# Patient Record
Sex: Male | Born: 1970 | Race: White | Hispanic: No | Marital: Single | Smoking: Current every day smoker
Health system: Southern US, Community
[De-identification: ages and names within clinical notes are randomized; demographics above are authoritative.]

## PROBLEM LIST (undated history)

## (undated) DIAGNOSIS — I10 Essential (primary) hypertension: Secondary | ICD-10-CM

## (undated) DIAGNOSIS — E785 Hyperlipidemia, unspecified: Secondary | ICD-10-CM

## (undated) DIAGNOSIS — A150 Tuberculosis of lung: Secondary | ICD-10-CM

## (undated) DIAGNOSIS — E8881 Metabolic syndrome: Secondary | ICD-10-CM

## (undated) DIAGNOSIS — R002 Palpitations: Secondary | ICD-10-CM

## (undated) DIAGNOSIS — G459 Transient cerebral ischemic attack, unspecified: Secondary | ICD-10-CM

## (undated) DIAGNOSIS — E669 Obesity, unspecified: Secondary | ICD-10-CM

## (undated) HISTORY — DX: Palpitations: R00.2

## (undated) HISTORY — DX: Hyperlipidemia, unspecified: E78.5

## (undated) HISTORY — DX: Essential (primary) hypertension: I10

## (undated) HISTORY — DX: Obesity, unspecified: E66.9

## (undated) HISTORY — DX: Metabolic syndrome: E88.810

## (undated) HISTORY — DX: Metabolic syndrome: E88.81

## (undated) HISTORY — DX: Transient cerebral ischemic attack, unspecified: G45.9

## (undated) HISTORY — DX: Tuberculosis of lung: A15.0

---
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
## 2004-04-18 HISTORY — PX: CHOLECYSTECTOMY: SHX55

## 2008-12-30 ENCOUNTER — Encounter: Admission: RE | Admit: 2008-12-30 | Discharge: 2008-12-30 | Payer: Self-pay | Admitting: Family Medicine

## 2008-12-30 ENCOUNTER — Ambulatory Visit: Payer: Self-pay | Admitting: Family Medicine

## 2008-12-30 DIAGNOSIS — E669 Obesity, unspecified: Secondary | ICD-10-CM | POA: Insufficient documentation

## 2008-12-31 ENCOUNTER — Encounter: Payer: Self-pay | Admitting: Family Medicine

## 2008-12-31 LAB — CONVERTED CEMR LAB
AST: 33 units/L (ref 0–37)
Alkaline Phosphatase: 98 units/L (ref 39–117)
BUN: 16 mg/dL (ref 6–23)
Calcium: 9.1 mg/dL (ref 8.4–10.5)
Chloride: 106 meq/L (ref 96–112)
Creatinine, Ser: 0.87 mg/dL (ref 0.40–1.50)
HCT: 49.1 % (ref 39.0–52.0)
HDL: 26 mg/dL — ABNORMAL LOW (ref >39)
Hemoglobin: 16.8 g/dL (ref 13.0–17.0)
MCHC: 34.2 g/dL (ref 30.0–36.0)
MCV: 95.2 fL (ref 78.0–100.0)
RDW: 13.4 % (ref 11.5–15.5)
TSH: 2.187 microintl units/mL (ref 0.350–4.50)
Total CHOL/HDL Ratio: 8.5

## 2009-01-01 ENCOUNTER — Encounter: Payer: Self-pay | Admitting: Family Medicine

## 2009-01-01 ENCOUNTER — Telehealth (INDEPENDENT_AMBULATORY_CARE_PROVIDER_SITE_OTHER): Payer: Self-pay | Admitting: *Deleted

## 2009-01-07 ENCOUNTER — Ambulatory Visit: Payer: Self-pay | Admitting: Family Medicine

## 2009-02-04 ENCOUNTER — Ambulatory Visit: Payer: Self-pay | Admitting: Family Medicine

## 2009-03-18 ENCOUNTER — Ambulatory Visit: Payer: Self-pay | Admitting: Family Medicine

## 2009-04-16 ENCOUNTER — Ambulatory Visit: Payer: Self-pay | Admitting: Family Medicine

## 2009-04-16 ENCOUNTER — Encounter (INDEPENDENT_AMBULATORY_CARE_PROVIDER_SITE_OTHER): Payer: Self-pay | Admitting: *Deleted

## 2009-05-15 ENCOUNTER — Ambulatory Visit: Payer: Self-pay | Admitting: Family Medicine

## 2009-05-15 LAB — CONVERTED CEMR LAB: Blood Glucose, Fingerstick: 110

## 2009-06-23 ENCOUNTER — Telehealth: Payer: Self-pay | Admitting: Family Medicine

## 2009-07-20 ENCOUNTER — Telehealth (INDEPENDENT_AMBULATORY_CARE_PROVIDER_SITE_OTHER): Payer: Self-pay | Admitting: *Deleted

## 2010-07-07 ENCOUNTER — Encounter: Payer: Self-pay | Admitting: Family Medicine

## 2010-07-19 ENCOUNTER — Ambulatory Visit: Payer: Self-pay | Admitting: Family Medicine

## 2010-07-23 ENCOUNTER — Encounter: Payer: Self-pay | Admitting: Family Medicine

## 2010-07-23 LAB — HM DIABETES EYE EXAM

## 2010-07-26 ENCOUNTER — Encounter: Payer: Self-pay | Admitting: Family Medicine

## 2010-08-19 ENCOUNTER — Ambulatory Visit: Payer: Self-pay | Admitting: Family Medicine

## 2010-08-19 LAB — CONVERTED CEMR LAB
Albumin/Creatinine Ratio, Urine, POC: <30
Creatinine,U: 300 mg/dL

## 2010-10-20 ENCOUNTER — Ambulatory Visit: Payer: Self-pay | Admitting: Family Medicine

## 2011-01-17 ENCOUNTER — Encounter
Admission: RE | Admit: 2011-01-17 | Discharge: 2011-01-17 | Payer: Self-pay | Source: Home / Self Care | Attending: Family Medicine | Admitting: Family Medicine

## 2011-01-17 ENCOUNTER — Telehealth: Payer: Self-pay | Admitting: Family Medicine

## 2011-01-18 NOTE — Miscellaneous (Signed)
Summary: no retinopathy  Clinical Lists Changes  Observations: Added new observation of DIAB EYE EX: no retinopathy (Dr Luretha Murphy) (07/23/2010 12:50)

## 2011-01-18 NOTE — Letter (Signed)
Summary: Patient No Show/[facility]  Patient No Show/[facility]   Imported By: Lanelle Bal 08/03/2010 12:03:33  _____________________________________________________________________  External Attachment:    Type:   Image     Comment:   External Document

## 2011-01-18 NOTE — Letter (Signed)
Summary: [facility]  [facility]   Imported By: Lanelle Bal 07/15/2010 10:50:04  _____________________________________________________________________  External Attachment:    Type:   Image     Comment:   External Document

## 2011-01-18 NOTE — Assessment & Plan Note (Signed)
Summary: f/u DM   Vital Signs:  Patient profile:   40 year old male Height:      70.75 inches Weight:      251 pounds BMI:     35.38 O2 Sat:      99 % on Room air Pulse rate:   79 / minute BP sitting:   128 / 79  (left arm) Cuff size:   large  Vitals Entered By: Payton Spark CMA (August 19, 2010 8:23 AM)  O2 Flow:  Room air CC: F/U DM   Primary Care Provider:  Seymour Bars DO  CC:  F/U DM.  History of Present Illness: Zachary Hendrix is a 40 year-old male with a history DM (DKA on 7/11), HTN, hyperlipidemia and obesity.  Today he reports that he been able to get his fasting glucose levels down to between 80 and 110 and his evening readings between 120 to 140 up until this past week.  He reports difficulty with planning meals with his family because they would like to consume meals that he can not have because they raise his blood sugars.  This provides a great amount of stress for the patient.  He denies feeling ill in the morning or any hypoglycemic symptoms.    He is trying to get on a regular eating schedule and consume breakfast earlier because he does feel bad with a headache and some nausea around lunch time.  Has yet to see the nutritionist but did have his dilated eye exam and it was normal in Aug.  He denies any syncopal episodes, polydipsia, polyruria or nocturia.  In addition, no chest pain, palpitations, shortness of breath, abdomial pain, diarrhea or constipation.    Current Medications (verified): 1)  Bayer Aspirin Ec Low Dose 81 Mg Tbec (Aspirin) .Marland Kitchen.. 1 Tab By Mouth Daily 2)  Lisinopril 10 Mg Tabs (Lisinopril) .... Take 1 Tab By Mouth Once Daily 3)  Lantus 100 Unit/ml Soln (Insulin Glargine) .... Inj 15 Units Once Daily 4)  Novolog 100 Unit/ml Soln (Insulin Aspart) .... Inj Per Sliding Scale 5)  Pravastatin Sodium 40 Mg Tabs (Pravastatin Sodium) .Marland Kitchen.. 1 Tab By Mouth Qhs  Allergies (verified): 1)  ! * Pain Meds  Past History:  Past Medical History: Reviewed  history from 07/19/2010 and no changes required. 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 Student at [employer], in [employer]. Lives with mom, nieces, nephew and brother. Has 2 kids  - in [place] and [place], not involved. Smokes 1/2 ppd , with hx of 60+ pack years. Poor diet.  No exercise. Not in a relationship.  Review of Systems      See HPI       See HPI  Physical Exam  General:  alert, well-developed, and well-nourished.  obese Head:  normocephalic and atraumatic.   Eyes:  PERRLA Sclera are white and the conjunctivae are clear Mouth:  pharynx pink and moist and fair dentition.   Neck:  supple and no thyromegaly.   Lungs:  Clear to auscultation bilaterally with no crackles, wheezes, rales or rhonchi Heart:  normal rate, regular rhythm, no murmur, no gallop, and no rub.   Abdomen:  soft, non-tender, and normal bowel sounds.   Pulses:  R popliteal normal, R posterior tibial normal, R dorsalis pedis normal, L popliteal normal, L posterior tibial normal,  and L dorsalis pedis normal.   Extremities:  No edema noted in the extremities Neurologic:  DTRs symmetrical and normal.   Skin:  color normal.   Psych:  normally interactive and good eye contact.    Diabetes Management Exam:    Foot Exam (with socks and/or shoes not present):       Sensory-Pinprick/Light touch:          Left medial foot (L-4): normal          Left dorsal foot (L-5): normal          Left lateral foot (S-1): normal          Right medial foot (L-4): normal          Right dorsal foot (L-5): normal          Right lateral foot (S-1): normal       Sensory-Monofilament:          Left foot: normal          Right foot: normal       Sensory-other: Vibratory sense and position sense are groosly intact.       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  He needs to work on dietary improvements, exercise and wt loss.  He is set up to see the nutritionist.  Had a normal eye exam.  Umicroalbumin normal today.  RTC in 2 mos.  Consider start of Kombliglyze. His updated medication list for this problem includes:    Bayer Aspirin Ec Low Dose 81 Mg Tbec (Aspirin) .Marland Kitchen... 1 tab by mouth daily    Lisinopril 10 Mg Tabs (Lisinopril) .Marland Kitchen... Take 1 tab by mouth once daily    Lantus 100 Unit/ml Soln (Insulin glargine) ..... Inj 15 units once daily    Novolog 100 Unit/ml Soln (Insulin aspart) ..... Inj per sliding scale  Orders: Urine Microalbumin (36644)  Labs Reviewed: Creat: 0.87 (12/31/2008)   Microalbumin: 30 (08/19/2010)  Last Eye Exam: no retinopathy (Dr Luretha Murphy) (07/23/2010) Reviewed HgBA1c results: 6.1 (05/15/2009)  Problem # 2:  HYPERTENSION, BENIGN ESSENTIAL (ICD-401.1) BP at goal.  Continue Lisinopril.  Update fasting labs at f/u appt. His updated medication list for this problem includes:    Lisinopril 10 Mg Tabs (Lisinopril) .Marland Kitchen... Take 1 tab by mouth once daily  BP today: 128/79 Prior BP: 124/78 (07/19/2010)  Labs Reviewed: K+: 4.4 (12/31/2008) Creat: : 0.87 (12/31/2008)   Chol: 220 (12/31/2008)   HDL: 26 (12/31/2008)   LDL: See Comment mg/dL (03/47/4259)   TG: 563 (12/31/2008)  Problem # 3:  HYPERLIPIDEMIA (ICD-272.4) Update fasting labs at f/u visit. His updated medication list for this problem includes:    Pravastatin Sodium 40 Mg Tabs (Pravastatin sodium) .Marland Kitchen... 1 tab by mouth qhs  Labs Reviewed: SGOT: 33 (12/31/2008)   SGPT: 67 (12/31/2008)   HDL:26 (12/31/2008)  LDL:See Comment mg/dL (87/56/4332)  RJJO:841 (12/31/2008)  Trig:466 (12/31/2008)  Complete Medication List: 1)  Bayer Aspirin Ec Low Dose 81 Mg Tbec (Aspirin) .Marland Kitchen.. 1 tab by  mouth daily 2)  Lisinopril 10 Mg Tabs (Lisinopril) .... Take 1 tab by mouth once daily 3)  Lantus 100 Unit/ml Soln (Insulin glargine) .... Inj 15 units once daily 4)  Novolog 100 Unit/ml Soln (  Insulin aspart) .... Inj per sliding scale 5)  Pravastatin Sodium 40 Mg Tabs (Pravastatin sodium) .Marland Kitchen.. 1 tab by mouth qhs  Patient Instructions: 1)  Stay on current meds. 2)  Meet with nutriitionist to work on diabetic diet and exercise. 3)  Return for follow up with fasting labs/ A1C in 2 mos.   Prescriptions: LISINOPRIL 10 MG TABS (LISINOPRIL) Take 1 tab by mouth once daily  #30 x 2   Entered and Authorized by:   Seymour Bars DO   Signed by:   Seymour Bars DO on 08/19/2010   Method used:   Electronically to        [facility] [phone]* (retail)       [address]       [place], [place]  [address]       Ph: [phone]       Fax: [phone]   RxID:   [id number]   Laboratory Results   Urine Tests    Microalbumin (urine): 30 mg/L Creatinine: 300mg /dL  A:C Ratio <24

## 2011-01-18 NOTE — Assessment & Plan Note (Signed)
Summary: f/u DM   Vital Signs:  Patient profile:   40 year old male Height:      70.75 inches Weight:      248 pounds BMI:     34.96 O2 Sat:      100 % on Room air Pulse rate:   73 / minute BP sitting:   116 / 76  (left arm) Cuff size:   large  Vitals Entered By: Payton Spark CMA (October 20, 2010 11:00 AM)  O2 Flow:  Room air CC: F/U DM, follow-up of diabetes Type 2   Primary Care Provider:  Seymour Bars DO  CC:  F/U DM and follow-up of diabetes Type 2.  History of Present Illness:       This is a 40 year old male who presents with follow-up of diabetes Type 2.  He went to the ED last wk for tongue numbness and facial numbness.  He had an MRI done and was told that he had an allergic reaction to Pravastatin (though he started this 3 mos ago).  He was changed to Niacin.  The patient denies polyuria, nocturia, hypoglycemia requiring assistance, and nocturnal hypoglycemia.  The patient denies any infections, angina, and edema.  Since the last visit the patient admits to dietary compliance is fair.  Patient notes eye care since last visit including seen by ophthalmologist.     Current Medications (verified): 1)  Bayer Aspirin Ec Low Dose 81 Mg Tbec (Aspirin) .Marland Kitchen.. 1 Tab By Mouth Daily 2)  Lisinopril 10 Mg Tabs (Lisinopril) .... Take 1 Tab By Mouth Once Daily 3)  Lantus 100 Unit/ml Soln (Insulin Glargine) .... Inj 15 Units Once Daily 4)  Novolog 100 Unit/ml Soln (Insulin Aspart) .... Inj Per Sliding Scale 5)  Niacin 500 Mg Tabs (Niacin)  Allergies: 1)  ! * Pain Meds 2)  ! Pravachol  Past History:  Past Medical History: Reviewed history from 07/19/2010 and no changes required. 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 Student at [employer], in [place]. Lives with mom, nieces, nephew and  brother. Has 2 kids  - in [place] and [place], not involved. Smokes 1/2 ppd , with hx of 60+ pack years. Poor diet.  No exercise. Not in a relationship.  Review of Systems      See HPI  Physical Exam  General:  alert, well-developed, well-nourished, and well-hydrated.   Head:  normocephalic, atraumatic, and male-pattern balding.   Eyes:  pupils equal, pupils round, and pupils reactive to light.   Mouth:  pharynx pink and moist, poor dentition, and teeth missing.  no problems swallowing Neck:  no masses.   Lungs:  Normal respiratory effort, chest expands symmetrically. Lungs are clear to auscultation, no crackles or wheezes. Heart:  normal rate, regular rhythm, no murmur, no gallop, and no rub.   Extremities:  No edema noted in the extremities Neurologic:  sensation intact to light touch.   Skin:  color normal.   Cervical Nodes:  No lymphadenopathy noted Psych:  good eye contact, not anxious appearing, and not depressed appearing.     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  Much improved diet and has just started to exercise.  Never did see the nutritionist.  Trellis Moment, eye exam and monofilament  UTD.  Declined flu shot.  Has only used Novolog as needed 2 x since last visit.  Will start him on Metformin 500 mg two times a day.  Update fasting labs in 4 wks.  Lab order given. Cut Lantus back to 10 units once daily. RTC with sugar readings in 3 mos. His updated medication list for this problem includes:    Bayer Aspirin Ec Low Dose 81 Mg Tbec (Aspirin) .Marland Kitchen... 1 tab by mouth daily    Lisinopril 10 Mg Tabs (Lisinopril) .Marland Kitchen... Take 1 tab by mouth once daily    Lantus 100 Unit/ml Soln (Insulin glargine) ..... Inject 10 units [place] once daily    Novolog 100 Unit/ml Soln (Insulin aspart) ..... Inj per sliding scale    Metformin Hcl 500 Mg Tabs (Metformin hcl) .Marland Kitchen... 1 tab by mouth two times a day with  meals  Orders: Fingerstick (04540) Hemoglobin A1C (98119) T-Comprehensive Metabolic Panel [phone])  Labs Reviewed: Creat: 0.87 (12/31/2008)   Microalbumin: 30 (08/19/2010)  Last Eye Exam: no retinopathy (Dr Luretha Murphy) (07/23/2010) Reviewed HgBA1c results: 5.7 (10/20/2010)  6.1 (05/15/2009)  Problem # 2:  HYPERLIPIDEMIA (ICD-272.4) Apparently, the ED thought he was allergic to Pravastatin given his tongue swelling last wk which has resolved off the Pravastatin.  he is now on Niacin.  Recheck Lipids with labs in 4 wks. The following medications were removed from the medication list:    Pravastatin Sodium 40 Mg Tabs (Pravastatin sodium) .Marland Kitchen... 1 tab by mouth qhs His updated medication list for this problem includes:    Niacin 500 Mg Tabs (Niacin)  Labs Reviewed: SGOT: 33 (12/31/2008)   SGPT: 67 (12/31/2008)   HDL:26 (12/31/2008)  LDL:See Comment mg/dL (30/86/5784)  ONGE:952 (12/31/2008)  Trig:466 (12/31/2008)  Orders: T-Lipid Profile ([id number])  Problem # 3:  HYPERTENSION, BENIGN ESSENTIAL (ICD-401.1) BP at goal on Lisinopril.  Continue. His updated medication list for this problem includes:    Lisinopril 10 Mg Tabs (Lisinopril) .Marland Kitchen... Take 1 tab by mouth once daily  BP today: 116/76 Prior BP: 128/79 (08/19/2010)  Labs Reviewed: K+: 4.4 (12/31/2008) Creat: : 0.87 (12/31/2008)   Chol: 220 (12/31/2008)   HDL: 26 (12/31/2008)   LDL: See Comment mg/dL (27/25/3664)   TG: 403 (12/31/2008)  Complete Medication List: 1)  Bayer Aspirin Ec Low Dose 81 Mg Tbec (Aspirin) .Marland Kitchen.. 1 tab by mouth daily 2)  Lisinopril 10 Mg Tabs (Lisinopril) .... Take 1 tab by mouth once daily 3)  Lantus 100 Unit/ml Soln (Insulin glargine) .... Inject 10 units [place] once daily 4)  Novolog 100 Unit/ml Soln (Insulin aspart) .... Inj per sliding scale 5)  Niacin 500 Mg Tabs (Niacin) 6)  Exel Pen Needles 1/3" 31g X 8 Mm Misc (Insulin pen needle) .... Use daily as directed 7)  Metformin Hcl 500 Mg Tabs  (Metformin hcl) .Marland Kitchen.. 1 tab by mouth two times a day with meals  Other Orders: T-CBC No Diff ([id number]) T-Iron ([id number]) T-Vitamin B12 (88416-60630)  Patient Instructions: 1)  Start Metformin 500 mg twice daily -- take with breakfast and dinner. 2)  Cut back on Lantus to 10 units once daily (OK to use PEN). 3)  Use NOVOLOG as needed for sliding scale only. 4)  Stick to LOW CARB/ low sugar diet and add 1 hr of exercise 5-6 days/ wk. 5)  AM fasting goal 80-120. 6)  2 hrs after dinner goal <150. 7)  Update fasting labs in 1 month. 8)  REturn for follow up in 3 mos. Prescriptions: METFORMIN HCL  500 MG TABS (METFORMIN HCL) 1 tab by mouth two times a day with meals  #60 x 3   Entered and Authorized by:   Seymour Bars DO   Signed by:   Seymour Bars DO on 10/20/2010   Method used:   Printed then faxed to ...       [facility] [phone]* (retail)       [address]       [place], [place]  [address]       Ph: [phone]       Fax: [phone]   RxID:   [id number] EXEL PEN NEEDLES 1/3" 31G X 8 MM MISC (INSULIN PEN NEEDLE) use daily as directed  #30 x 3   Entered and Authorized by:   Seymour Bars DO   Signed by:   Seymour Bars DO on 10/20/2010   Method used:   Electronically to        [facility] [phone]* (retail)       [address]       [place], [place]  [address]       Ph: [phone]       Fax: [phone]   RxID:   [id number]    Orders Added: 1)  Fingerstick [36416] 2)  Hemoglobin A1C [83036] 3)  T-CBC No Diff [85027-10000] 4)  T-Iron [35573-22025] 5)  T-Comprehensive Metabolic Panel [80053-22900] 6)  T-Lipid Profile [80061-22930] 7)  T-Vitamin B12 [82607-23330] 8)  Est. Patient Level IV [99214]    Laboratory Results   Blood Tests     HGBA1C: 5.7%   (Normal Range: Non-Diabetic - 3-6%   Control Diabetic - 6-8%)

## 2011-01-18 NOTE — Assessment & Plan Note (Signed)
Summary: HFU DKA   Vital Signs:  Patient profile:   40 year old male Height:      70.75 inches Weight:      255 pounds BMI:     35.95 O2 Sat:      98 % on Room air Pulse rate:   76 / minute BP sitting:   124 / 78  (left arm) Cuff size:   large  Vitals Entered By: Payton Spark CMA (July 19, 2010 9:06 AM)  O2 Flow:  Room air CC: Hosp f/u. AM fasting sugar 126.   Primary Care Provider:  Seymour Bars DO  CC:  Hosp f/u. AM fasting sugar 126.Zachary Hendrix  History of Present Illness: 40 yo WM presents for HFU visit.  He was admitted to [facility] with DKA.  He had been feeling ill for 2 wks prior to going to the hospital.  He had polydipsia and had been drinking lots of milk and soda.  Prior to this, he had diet - controlled DM and I had not seen in a year.    He was discharged home on Lantus 15 units at bedtime.  His AM fastings are running around 150.  He is rarely needing to use his Novolog for SSI which he had to purchase out of pocket.  He has trouble paying for meds.  His energy level and GI symptoms have really improved.  His polydipsia has resolved.  He has blurry vision.  He has an eye appt today.  He was put back on Lisinopril for his HTN.    He did not attend nutrion classes but found out that he can go for free in [employer].     [employer] meter with test strips.   Current Medications (verified): 1)  Bayer Aspirin Ec Low Dose 81 Mg Tbec (Aspirin) .Zachary Hendrix.. 1 Tab By Mouth Daily 2)  Lisinopril 10 Mg Tabs (Lisinopril) .... Take 1 Tab By Mouth Once Daily 3)  Lantus 100 Unit/ml Soln (Insulin Glargine) .... Inj 15 Units Once Daily 4)  Novolog 100 Unit/ml Soln (Insulin Aspart) .... Inj Per Sliding Scale 5)  Potassium  Allergies (verified): 1)  ! 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 R foot surgery 1982 lap chole 04-2004  Social History: Reviewed history from 12/30/2008 and no changes required. Student at [employer], in [place]. Lives with mom, nieces, nephew and brother. Has 2 kids  - in [place] and [place], not involved. Smokes 1/2 ppd , with hx of 60+ pack years. Poor diet.  No exercise. Not in a relationship.  Review of Systems General:  Complains of weight loss; denies fatigue, fever, loss of appetite, and sweats. Eyes:  Complains of blurring. CV:  Denies chest pain or discomfort, shortness of breath with exertion, and swelling of feet. GI:  Denies diarrhea, nausea, and vomiting. GU:  Complains of urinary frequency.  Physical Exam  General:  alert, well-developed, well-nourished, well-hydrated, and overweight-appearing.   Head:  normocephalic and atraumatic.   Eyes:  pupils equal, pupils round, and pupils reactive to light.   Mouth:  pharynx pink and moist.  2+ tonsilar hypertrophy Neck:  no masses.   Lungs:  normal respiratory effort, no intercostal retractions, no accessory muscle use, normal breath sounds, and no wheezes. Heart:  Normal rate and regular rhythm. S1 and S2 normal without gallop, murmur, click, rub or other extra sounds. Extremities:  1+ pitting LE edema bilat Neurologic:  sensation intact to light touch.   Skin:  color normal.   Cervical Nodes:  No lymphadenopathy noted Psych:  good eye contact, not anxious appearing, and not depressed appearing.     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  His A1C was 13.7 last wk.  His sugars have much improved with addition of insulin.  Will set up nutrition counseling for new dx.  Continue current meds.  Has a meter and test strips.  Set up for eye exam today.  Will get urine microalbumin at next visit. The following medications were removed from the  medication list:    Enalapril Maleate 10 Mg Tabs (Enalapril maleate) .Zachary Hendrix... 1 tab by mouth daily His updated medication list for this problem includes:    Bayer Aspirin Ec Low Dose 81 Mg Tbec (Aspirin) .Zachary Hendrix... 1 tab by mouth daily    Lisinopril 10 Mg Tabs (Lisinopril) .Zachary Hendrix... Take 1 tab by mouth once daily    Lantus 100 Unit/ml Soln (Insulin glargine) ..... Inj 15 units once daily    Novolog 100 Unit/ml Soln (Insulin aspart) ..... Inj per sliding scale  Orders: Nutrition Referral (Nutrition)  Problem # 2:  HYPERLIPIDEMIA (ICD-272.4) Tchol was 416 with TGS 3000 in the hospital which did improved to 2400 after sugars had improved.  Will add Pravastatin at bedtime.  I do expect improvement with improvement in sugars. The following medications were removed from the medication list:    Pravastatin Sodium 40 Mg Tabs (Pravastatin sodium) .Zachary Hendrix... 1 tab by mouth qhs His updated medication list for this problem includes:    Pravastatin Sodium 40 Mg Tabs (Pravastatin sodium) .Zachary Hendrix... 1 tab by mouth qhs  Problem # 3:  HYPERTENSION, BENIGN ESSENTIAL (ICD-401.1) Assessment: Improved  The following medications were removed from the medication list:    Metoprolol Tartrate 50 Mg Tabs (Metoprolol tartrate) .Zachary Hendrix... 1 tab by mouth bid    Enalapril Maleate 10 Mg Tabs (Enalapril maleate) .Zachary Hendrix... 1 tab by mouth daily    Chlorthalidone 50 Mg Tabs (Chlorthalidone) .Zachary Hendrix... Take 1 tablet by mouth once a day His updated medication list for this problem includes:    Lisinopril 10 Mg Tabs (Lisinopril) .Zachary Hendrix... Take 1 tab by mouth once daily  BP today: 124/78 Prior BP: 131/89 (05/15/2009)  Labs Reviewed: K+: 4.4 (12/31/2008) Creat: : 0.87 (12/31/2008)   Chol: 220 (12/31/2008)   HDL: 26 (12/31/2008)   LDL: See Comment mg/dL (95/62/1308)   TG: 657 (12/31/2008)  Problem # 4:  OTHER NONSPECIFIC ABNORMAL SERUM ENZYME LEVELS (ICD-790.5) LFTS high during hosp visit, likely due to fatty liver. Will recheck at 4 wk f/u appt esp with  addition of statin. Will need RUQ u/s and hepatitis screen if not improved.  Complete Medication List: 1)  Bayer Aspirin Ec Low Dose 81 Mg Tbec (Aspirin) .Zachary Hendrix.. 1 tab by mouth daily 2)  Lisinopril 10 Mg Tabs (Lisinopril) .... Take 1 tab by mouth once daily 3)  Lantus 100 Unit/ml Soln (Insulin glargine) .... Inj 15 units once daily 4)  Novolog 100 Unit/ml Soln (Insulin aspart) .... Inj per sliding scale 5)  Pravastatin Sodium 40 Mg Tabs (Pravastatin sodium) .Zachary Hendrix.. 1 tab by mouth qhs  Patient Instructions: 1)  Start on Pravastatin at bedtime for high cholesterol. 2)  Stay on Lantus 15 units once a  day. 3)  Check sugar AM fasting (80-120 is goal).  and 2 hrs after dinner (100-150 is goal). 4)  Will refer to nutritionist. 5)  Return in 4 wks for follow up diabetes.  (will need CMP, FLP, urine microalbuminuria).   Prescriptions: PRAVASTATIN SODIUM 40 MG TABS (PRAVASTATIN SODIUM) 1 tab by mouth qhs  #30 x 2   Entered and Authorized by:   Seymour Bars DO   Signed by:   Seymour Bars DO on 07/19/2010   Method used:   Electronically to        [facility] [phone]* (retail)       [address]       [place], [place]  [address]       Ph: [phone]       Fax: [phone]   RxID:   [id number] PRAVASTATIN SODIUM 40 MG TABS (PRAVASTATIN SODIUM) 1 tab by mouth qhs  #30 x 2   Entered and Authorized by:   Seymour Bars DO   Signed by:   Seymour Bars DO on 07/19/2010   Method used:   Electronically to        [facility] [phone]* (retail)       [address]       [place], [place]  [address]       Ph: [phone]       Fax: [phone]   RxID:   [id number]

## 2011-01-20 ENCOUNTER — Encounter: Payer: Self-pay | Admitting: Family Medicine

## 2011-01-20 ENCOUNTER — Ambulatory Visit (INDEPENDENT_AMBULATORY_CARE_PROVIDER_SITE_OTHER): Payer: Self-pay | Admitting: Family Medicine

## 2011-01-20 ENCOUNTER — Ambulatory Visit: Admit: 2011-01-20 | Payer: Self-pay | Admitting: Family Medicine

## 2011-01-20 DIAGNOSIS — E119 Type 2 diabetes mellitus without complications: Secondary | ICD-10-CM

## 2011-01-20 DIAGNOSIS — E785 Hyperlipidemia, unspecified: Secondary | ICD-10-CM

## 2011-01-20 DIAGNOSIS — E109 Type 1 diabetes mellitus without complications: Secondary | ICD-10-CM

## 2011-01-26 NOTE — Progress Notes (Signed)
Summary: Needs chest xray  Phone Note Call from Patient   Caller: Patient Summary of Call: Pt needs order for chest xray for TB documentation. Pt will have xray done this afternoon.  Initial call taken by: Payton Spark CMA,  January 17, 2011 2:46 PM  Follow-up for Phone Call        done. Follow-up by: Seymour Bars DO,  January 17, 2011 3:06 PM     Appended Document: Needs chest xray faxed

## 2011-01-26 NOTE — Assessment & Plan Note (Signed)
Summary: f/u DM   Vital Signs:  Patient profile:   40 year old male Height:      70.75 inches Weight:      259 pounds BMI:     36.51 O2 Sat:      98 % on Room air Pulse rate:   84 / minute BP sitting:   136 / 89  (left arm) Cuff size:   large  Vitals Entered By: Payton Spark CMA (January 20, 2011 10:14 AM)  O2 Flow:  Room air CC: F/U DM   Primary Care Provider:  Seymour Bars DO  CC:  F/U DM.  History of Present Illness: Zachary Hendrix presents for f/u T2DM.  He was started on Metformin 500 mg two times a day 3 mos ago and is still taking Lantus 10 units once a day.  He has not needed the Novolog at all.  He has some dietary non adherence.  AM fastings are 120-140.  He is still looking for work as a Engineer, civil (consulting).  He is not exercising at all and has continued to gain wt.      Current Medications (verified): 1)  Bayer Aspirin Ec Low Dose 81 Mg Tbec (Aspirin) .Marland Kitchen.. 1 Tab By Mouth Daily 2)  Lisinopril 10 Mg Tabs (Lisinopril) .... Take 1 Tab By Mouth Once Daily 3)  Lantus 100 Unit/ml Soln (Insulin Glargine) .... Inject 10 Units [place] Once Daily 4)  Novolog 100 Unit/ml Soln (Insulin Aspart) .... Inj Per Sliding Scale 5)  Niacin 500 Mg Tabs (Niacin) 6)  Exel Pen Needles 1/3" 31g X 8 Mm Misc (Insulin Pen Needle) .... Use Daily As Directed 7)  Metformin Hcl 500 Mg Tabs (Metformin Hcl) .Marland Kitchen.. 1 Tab By Mouth Two Times A Day With Meals  Allergies (verified): 1)  ! * Pain Meds 2)  ! Pravachol  Past History:  Past Medical History: Reviewed history from 07/19/2010 and no changes required. HTN obesity high cholesterol DM (DKA 06-2010) TIA Irreg heartbeat/ palpitations  metabolic syndrome hx of TB, treated in 2000, 12-2008 CXR normal   Impression & Recommendations:  Problem # 1:  DIABETES MELLITUS, TYPE II (ICD-250.00) A1C looks great at 5.8.  Will STOP both Novolog and Lantus.  Will increase Metformin to 1gram in the AM and 500 mg at night with food.  He does need to work on his dietary  adherence, wt loss and regular exercise given BMI of 36.5 c/w class II obesity.  BP a little high today.  Overdue for labs, so obtained non fasting today. The following medications were removed from the medication list:    Lantus 100 Unit/ml Soln (Insulin glargine) ..... Inject 10 units [place] once daily    Novolog 100 Unit/ml Soln (Insulin aspart) ..... Inj per sliding scale His updated medication list for this problem includes:    Bayer Aspirin Ec Low Dose 81 Mg Tbec (Aspirin) .Marland Kitchen... 1 tab by mouth daily    Lisinopril 10 Mg Tabs (Lisinopril) .Marland Kitchen... Take 1 tab by mouth once daily    Metformin Hcl 500 Mg Tabs (Metformin hcl) .Marland Kitchen... 2 tabs by mouth qam and 1 tab by mouth qpm with food  Orders: T-Comprehensive Metabolic Panel [phone])  Labs Reviewed: Creat: 0.87 (12/31/2008)   Microalbumin: 30 (08/19/2010)  Last Eye Exam: no retinopathy (Dr Luretha Murphy) (07/23/2010) Reviewed HgBA1c results: 5.8 (01/20/2011)  5.7 (10/20/2010)  Problem # 2:  HYPERLIPIDEMIA (ICD-272.4) Check LDL -direct today.  Off statin due to allergic rxn per the ED. His updated medication  list for this problem includes:    Niacin 500 Mg Tabs (Niacin)  Orders: T-Lipoprotein (LDL cholesterol)  ([id number])  Complete Medication List: 1)  Bayer Aspirin Ec Low Dose 81 Mg Tbec (Aspirin) .Marland Kitchen.. 1 tab by mouth daily 2)  Lisinopril 10 Mg Tabs (Lisinopril) .... Take 1 tab by mouth once daily 3)  Niacin 500 Mg Tabs (Niacin) 4)  Exel Pen Needles 1/3" 31g X 8 Mm Misc (Insulin pen needle) .... Use daily as directed 5)  Metformin Hcl 500 Mg Tabs (Metformin hcl) .... 2 tabs by mouth qam and 1 tab by mouth qpm with food  Other Orders: Fingerstick (36416) Hemoglobin A1C (13244)  Patient Instructions: 1)  Stop Novolog. 2)  Stop Lantus. 3)  Increase Metformin to 2 tabs in the morning and 1 tab in the evening (with meals). 4)  AM fasting goal is 80-120; 2 hr after dinner goal is <150. 5)  Adhere to LOW SUGAR, LOW CARB DIET with  regular exercise. 6)  Labs today. 7)  Will call you w/ results tomorrow. 8)  Return for follow up in 4 mos. Prescriptions: METFORMIN HCL 500 MG TABS (METFORMIN HCL) 2 tabs by mouth qAM and 1 tab by mouth qPM with food  #90 x 3   Entered and Authorized by:   Seymour Bars DO   Signed by:   Seymour Bars DO on 01/20/2011   Method used:   Electronically to        [facility] [phone]* (retail)       [address]       [place], [place]  [address]       Ph: [phone]       Fax: [phone]   RxID:   [id number]    Orders Added: 1)  Fingerstick [36416] 2)  Hemoglobin A1C [83036] 3)  T-Lipoprotein (LDL cholesterol)  [[id number]] 4)  T-Comprehensive Metabolic Panel [[id number]] 5)  Est. Patient Level III [99213]    Laboratory Results   Blood Tests     HGBA1C: 5.8%   (Normal Range: Non-Diabetic - 3-6%   Control Diabetic - 6-8%)

## 2011-05-22 ENCOUNTER — Encounter: Payer: Self-pay | Admitting: Family Medicine

## 2011-05-25 ENCOUNTER — Telehealth: Payer: Self-pay | Admitting: Family Medicine

## 2011-05-25 ENCOUNTER — Ambulatory Visit (INDEPENDENT_AMBULATORY_CARE_PROVIDER_SITE_OTHER): Payer: PRIVATE HEALTH INSURANCE | Admitting: Family Medicine

## 2011-05-25 ENCOUNTER — Encounter: Payer: Self-pay | Admitting: Family Medicine

## 2011-05-25 VITALS — BP 156/94 | HR 67 | Ht 72.0 in | Wt 262.0 lb

## 2011-05-25 DIAGNOSIS — I10 Essential (primary) hypertension: Secondary | ICD-10-CM

## 2011-05-25 DIAGNOSIS — F17200 Nicotine dependence, unspecified, uncomplicated: Secondary | ICD-10-CM | POA: Insufficient documentation

## 2011-05-25 DIAGNOSIS — E785 Hyperlipidemia, unspecified: Secondary | ICD-10-CM

## 2011-05-25 DIAGNOSIS — E119 Type 2 diabetes mellitus without complications: Secondary | ICD-10-CM

## 2011-05-25 MED ORDER — METFORMIN HCL ER 750 MG PO TB24: 750.0000 mg | ORAL_TABLET | Freq: Every day | ORAL | Status: DC

## 2011-05-25 MED ORDER — LISINOPRIL 20 MG PO TABS: 20.0000 mg | ORAL_TABLET | Freq: Every day | ORAL | Status: DC

## 2011-05-25 NOTE — Assessment & Plan Note (Signed)
A1C looks great at 5.6.  Will change his metformin to XR,. 750 mg once daily.  He plans to continue to work on diet, exercise, wt loss.

## 2011-05-25 NOTE — Patient Instructions (Signed)
Change Metformin to Metformin XR 750 mg with breakfast daily.  Increase Lisinopril to 20 mg once daily for BP.  Update fasting labs one morning downstairs. Will call you w/ results.  Work on adding regular exercise and smoking cessation.  Return for a PHYSICAL in 4 months.

## 2011-05-25 NOTE — Progress Notes (Signed)
  Subjective:    Patient ID: Zachary Hendrix, male    DOB: 01/22/1971, 40 y.o.   MRN: [id number]  HPI  40 yo WM presents for f/u T2DM.  He is doing well on metformin, on 1 gram in the AM and 500 mg in the evening.  He has yet to start exercising.  He is working long hours, so has a hard time remembering to take all of his meds.  Fasting labs are due.  Denies chest pain or DOE.  His eye exam and urine microalbumin are UTD.  Not routinely checking his sugars at home.  Continues to smoke.  Not ready to quit.    BP 156/94  Pulse 67  Ht 6' (1.829 m)  Wt 262 lb (118.842 kg)  BMI 35.53 kg/m2  SpO2 99% Patient Active Problem List  Diagnoses  . PRIM TB INF UNS EX UNKN  . B12 DEFICIENCY  . HYPERLIPIDEMIA  . OBESITY, UNSPECIFIED  . ANEMIA, IRON DEFICIENCY  . HYPERTENSION, BENIGN ESSENTIAL  . CONSTIPATION  . DIABETES MELLITUS, TYPE II  . Tobacco use disorder      Review of Systems  Constitutional: Negative for fatigue and unexpected weight change.  Eyes: Negative for visual disturbance.  Respiratory: Negative for shortness of breath.   Cardiovascular: Negative for chest pain, palpitations and leg swelling.  Gastrointestinal: Negative for abdominal pain.  Genitourinary: Negative for frequency.  Neurological: Negative for numbness.  Psychiatric/Behavioral: Negative for dysphoric mood.       Objective:   Physical Exam  Constitutional: He appears well-developed and well-nourished. No distress.  Neck: Neck supple. No thyromegaly present.  Cardiovascular: Normal rate, regular rhythm and normal heart sounds.   Pulmonary/Chest: Effort normal and breath sounds normal. No respiratory distress. He has no wheezes.  Musculoskeletal: He exhibits no edema.  Lymphadenopathy:    He has no cervical adenopathy.  Skin: Skin is warm and dry.  Psychiatric: He has a normal mood and affect.          Assessment & Plan:

## 2011-05-25 NOTE — Assessment & Plan Note (Signed)
BP remains high.  Will go up on his lisinopril from 10--> 20 mg/ day and will update his fasting labs.

## 2011-06-08 ENCOUNTER — Telehealth: Payer: Self-pay | Admitting: *Deleted

## 2011-06-08 ENCOUNTER — Ambulatory Visit: Payer: PRIVATE HEALTH INSURANCE | Admitting: Family Medicine

## 2011-06-08 DIAGNOSIS — R748 Abnormal levels of other serum enzymes: Secondary | ICD-10-CM

## 2011-06-08 LAB — COMPLETE METABOLIC PANEL WITH GFR
Alkaline Phosphatase: 84 U/L (ref 39–117)
BUN: 13 mg/dL (ref 6–23)
Creat: 0.92 mg/dL (ref 0.50–1.35)
GFR, Est African American: >60 mL/min (ref >60)
GFR, Est Non African American: >60 mL/min (ref >60)
Glucose, Bld: 102 mg/dL — ABNORMAL HIGH (ref 70–99)
Total Bilirubin: 0.5 mg/dL (ref 0.3–1.2)

## 2011-06-08 LAB — LIPID PANEL
Cholesterol: 176 mg/dL (ref 0–200)
HDL: 26 mg/dL — ABNORMAL LOW (ref >39)
Total CHOL/HDL Ratio: 6.8 Ratio

## 2011-06-08 LAB — CBC WITH DIFFERENTIAL/PLATELET
Basophils Relative: 0 % (ref 0–1)
Eosinophils Absolute: 0.5 10*3/uL (ref 0.0–0.7)
Eosinophils Relative: 5 % (ref 0–5)
HCT: 47.6 % (ref 39.0–52.0)
Hemoglobin: 16.5 g/dL (ref 13.0–17.0)
MCH: 32.2 pg (ref 26.0–34.0)
MCHC: 34.7 g/dL (ref 30.0–36.0)
MCV: 93 fL (ref 78.0–100.0)
Monocytes Absolute: 0.4 10*3/uL (ref 0.1–1.0)
Monocytes Relative: 4 % (ref 3–12)
Neutrophils Relative %: 54 % (ref 43–77)

## 2011-06-08 MED ORDER — PROMETHAZINE HCL 25 MG PO TABS: 25.0000 mg | ORAL_TABLET | Freq: Four times a day (QID) | ORAL | Status: AC | PRN

## 2011-06-08 NOTE — Telephone Encounter (Signed)
none

## 2011-06-08 NOTE — Telephone Encounter (Signed)
I sent in oral phenergan tabs for nausea.  Remember they will make you sleepy.  Sip on clear fluids today and advance diet slowly as you start to feel better.  OK to add Immodium AD as needed for diarrhea today and tomorrow.  Let us know if not resolved by Monday.

## 2011-06-08 NOTE — Telephone Encounter (Signed)
LMOM for Pt to CB 

## 2011-06-08 NOTE — Telephone Encounter (Signed)
Pls let pt know that his blood counts came back normal.  His liver enzymes are a little high.  His TGs are elevated at 294 (<150 is goal) and his HDL good chol is too low at 26.  Is he taking his Niaspan everyday for this?    Work on [employer] and regular exercise. Will order an U/s of his liver to be done downstairs next wk.

## 2011-06-08 NOTE — Telephone Encounter (Signed)
Pt states he has had N/V/D x 4 days. He denies fever and pain. I advised Pt to stay hydrated and get plenty of rest. Pt will go to ED if he developes fever or pain. Pt does request phenergan be sent to pharm. Please advise.

## 2011-06-09 ENCOUNTER — Ambulatory Visit
Admission: RE | Admit: 2011-06-09 | Discharge: 2011-06-09 | Disposition: A | Discharge status: Home / Self Care | Payer: PRIVATE HEALTH INSURANCE | Source: Ambulatory Visit | Attending: Family Medicine | Admitting: Family Medicine

## 2011-06-09 ENCOUNTER — Telehealth: Payer: Self-pay | Admitting: Family Medicine

## 2011-06-09 DIAGNOSIS — R748 Abnormal levels of other serum enzymes: Secondary | ICD-10-CM

## 2011-06-09 NOTE — Telephone Encounter (Signed)
Pt called this am regarding his labs.  Pt re-notified regarding his labs.  He is taking his Niaspan everyday.  Gave detailed instructions. Jarvis Newcomer, LPN Domingo Dimes

## 2011-06-10 ENCOUNTER — Telehealth: Payer: Self-pay | Admitting: Family Medicine

## 2011-06-10 NOTE — Telephone Encounter (Signed)
Called patient: The ultrasound of his abdomen shows he has a fatty liver. An little bit of enlargement of his liver. The main treatment for this is exercise, low fat diet and weight loss. Over time if he does not improve his fatty liver disease then he can call scarring of the liver. I do recommend we recheck his liver enzymes in approximately 3-4 months.

## 2011-06-13 NOTE — Telephone Encounter (Signed)
Pt aware of the above  

## 2011-08-03 ENCOUNTER — Telehealth: Payer: Self-pay | Admitting: Family Medicine

## 2011-08-03 ENCOUNTER — Ambulatory Visit: Payer: PRIVATE HEALTH INSURANCE | Admitting: Family Medicine

## 2011-08-03 MED ORDER — METFORMIN HCL 500 MG PO TABS: 500.0000 mg | ORAL_TABLET | Freq: Two times a day (BID) | ORAL | Status: DC

## 2011-08-03 NOTE — Telephone Encounter (Signed)
Pt showed up in the lobby and is complaining that his last office visit 05-25-11 he was changed to Metformin XR 750mg  PO and since that time his BS'S have been running higher 128-184.  Pt is requesting to go back to Metformin 500mg  PO BID because his sugars on that med was running <120.  Pt said his mother spoke with Payton Spark, CMA yesterday and Marcelino Duster had said the pt could come in today for just a HgbA1C only today.  There wasn't a telephone note or in basket mess to let me know that this had been communicated. Plan:  Spoke with Dr. Linford Arnold and will change the pt back to his Metformin 500mg  PO BID and needs to return around Sept  2012 for a diabetic followup.  Pt said he was told Oct 2012 by Dr. Cathey Endow since he is due for CPE.  Told pt that should be fine but to keep running log of his BS's and to call with any problems.   Addendum:  New script for the metformin 500mg  PO BID #60/2 refills was sent to [facility]. Jarvis Newcomer, LPN [facility] Dimes

## 2011-08-04 NOTE — Progress Notes (Signed)
This was entered in error

## 2011-08-10 ENCOUNTER — Encounter: Payer: Self-pay | Admitting: Family Medicine

## 2011-08-24 ENCOUNTER — Ambulatory Visit: Payer: PRIVATE HEALTH INSURANCE | Admitting: Family Medicine

## 2011-08-30 ENCOUNTER — Ambulatory Visit: Payer: PRIVATE HEALTH INSURANCE | Admitting: Family Medicine

## 2011-08-30 DIAGNOSIS — Z0289 Encounter for other administrative examinations: Secondary | ICD-10-CM

## 2011-09-29 ENCOUNTER — Encounter: Payer: PRIVATE HEALTH INSURANCE | Admitting: Family Medicine

## 2011-09-29 DIAGNOSIS — Z0289 Encounter for other administrative examinations: Secondary | ICD-10-CM

## 2011-10-14 ENCOUNTER — Telehealth: Payer: Self-pay | Admitting: *Deleted

## 2012-08-31 IMAGING — CR DG CHEST 2V
2 series · 2 of 2 positions shown · non-contrast
Comparison: 12/30/2008

CLINICAL DATA: Employment physical

CHEST - 2 VIEW

[view not recorded (1 of 2)]
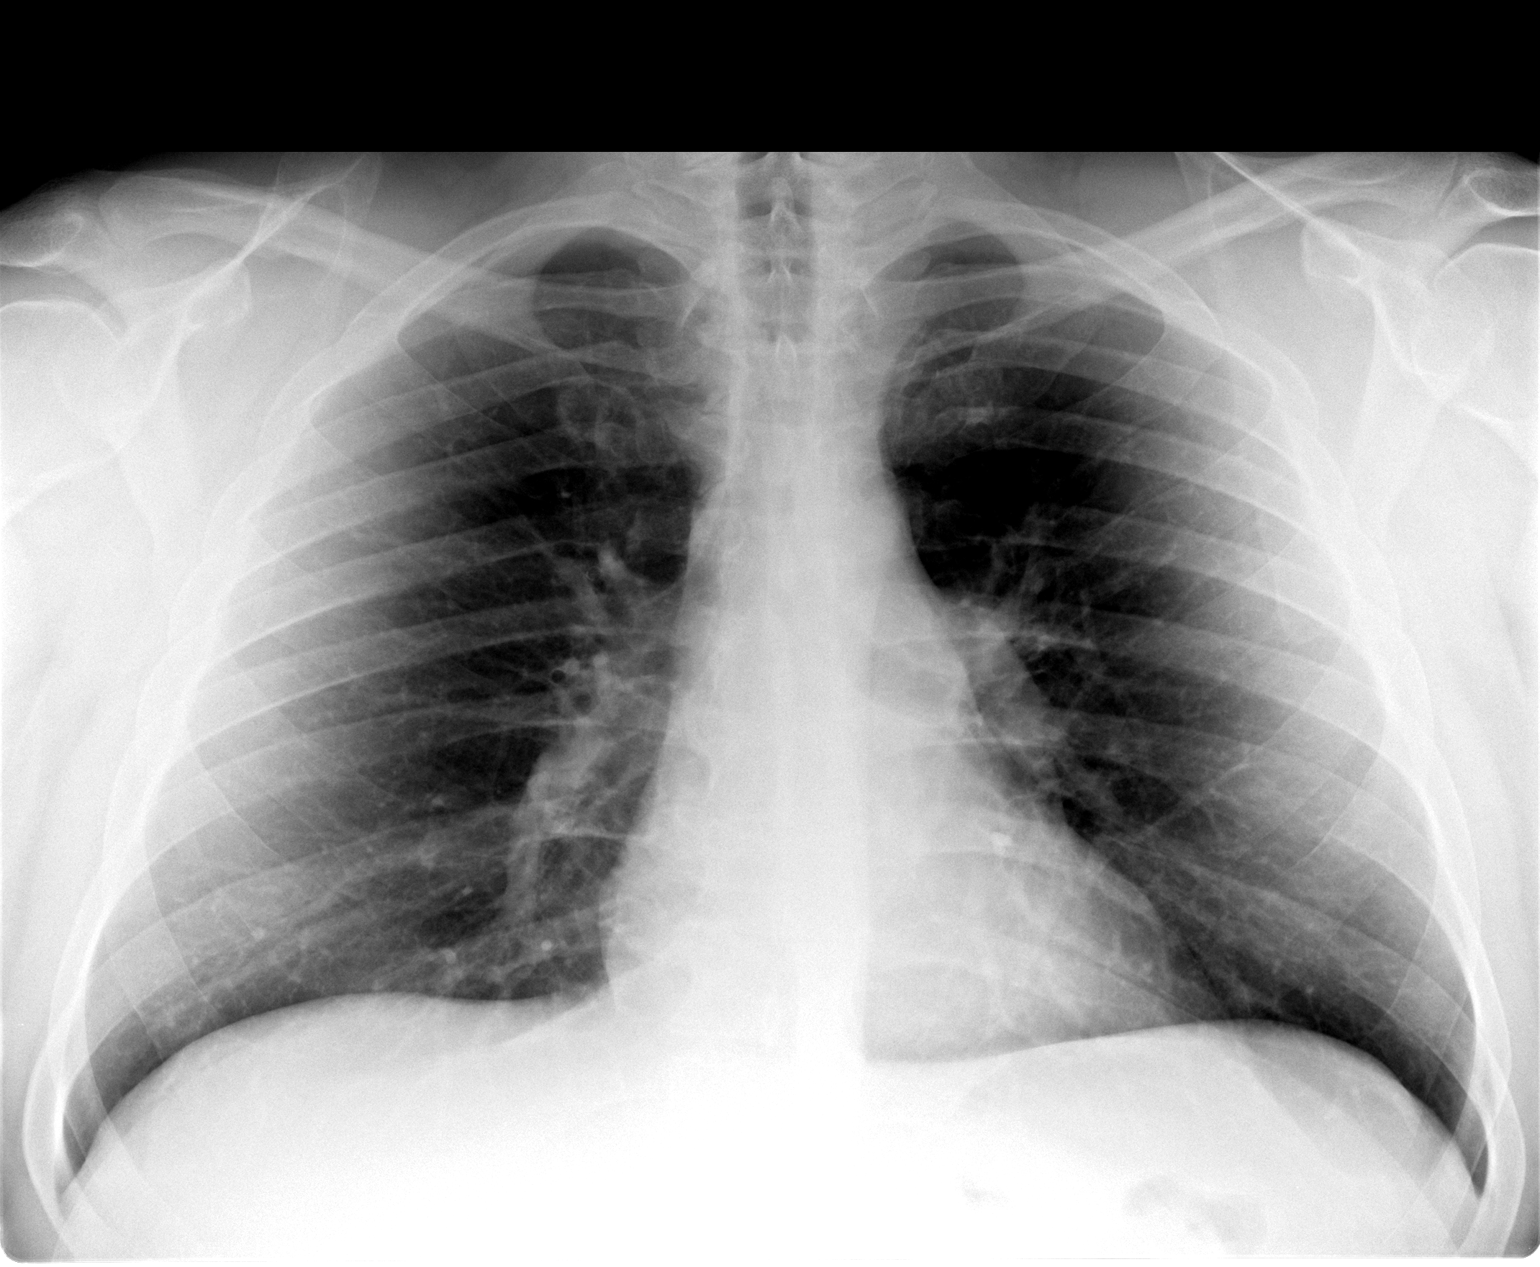

[view not recorded (2 of 2)]
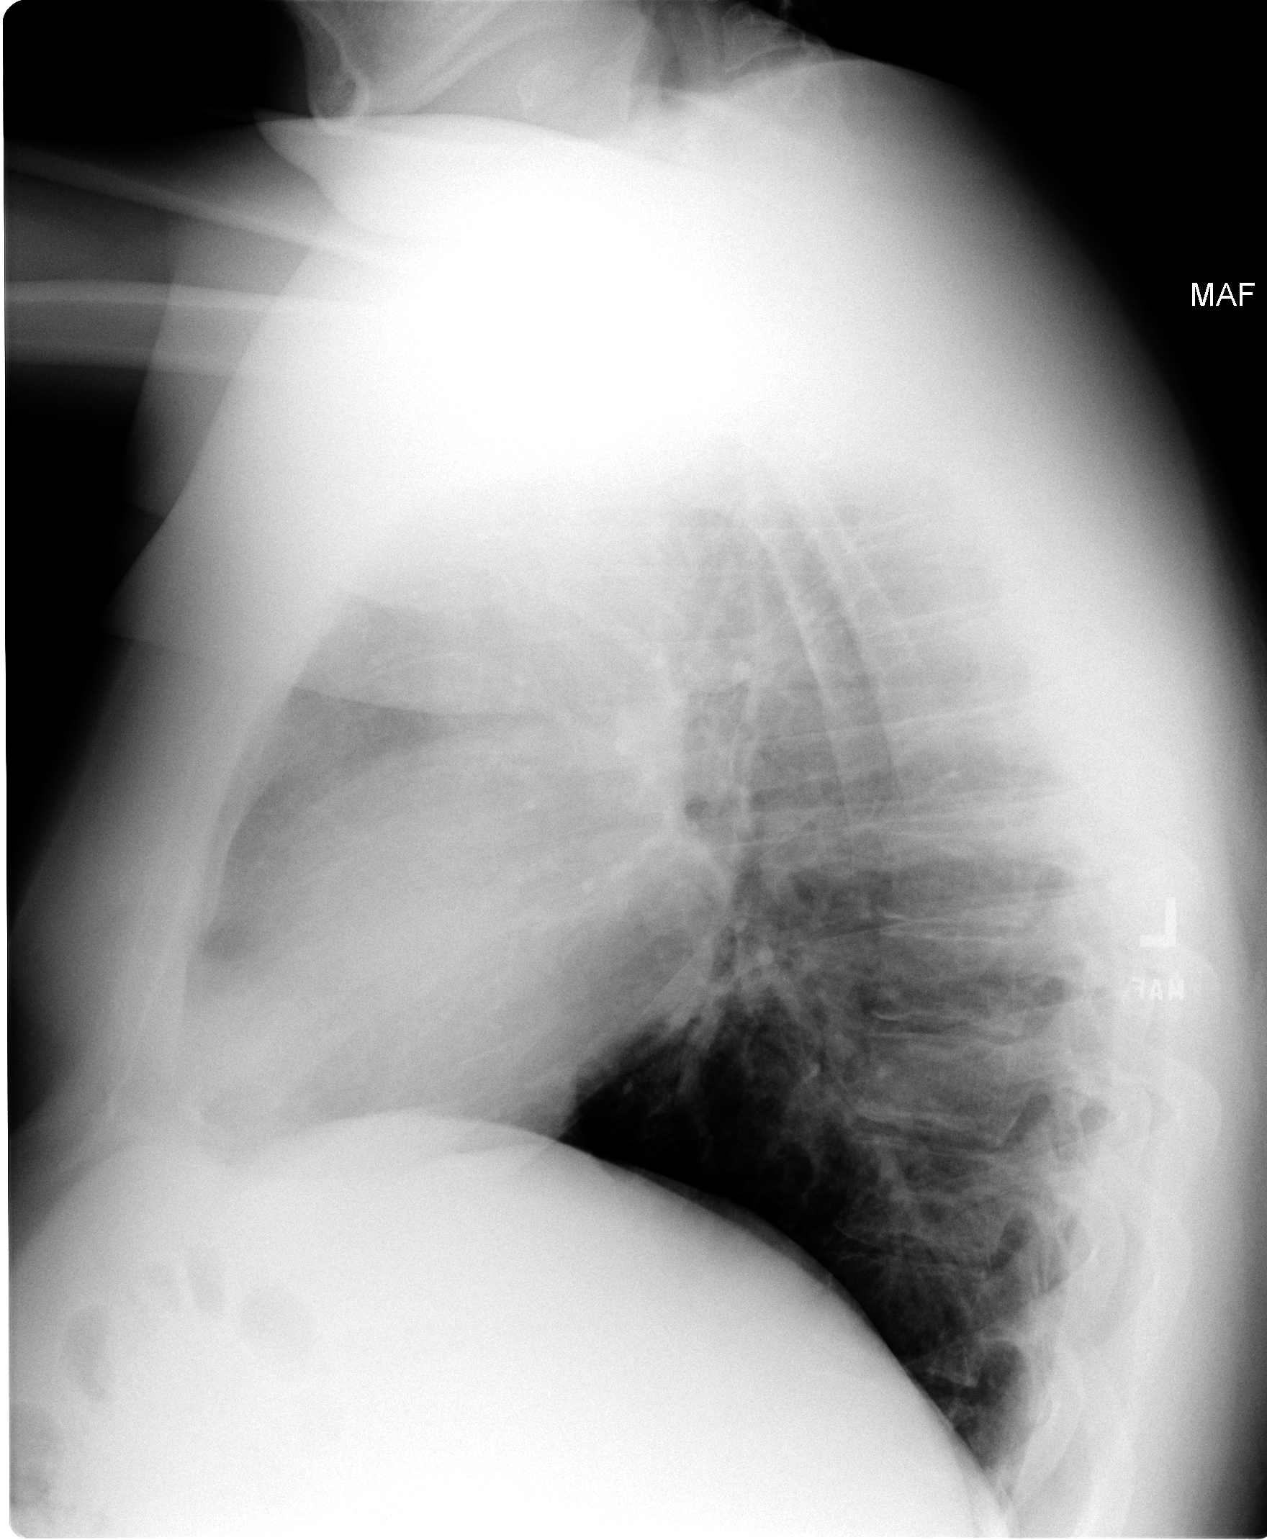

[2 of 2 positions shown; findings below may reference images not displayed]

FINDINGS: Lungs clear.  Heart size and pulmonary vascularity
normal.  No effusion.  Visualized bones unremarkable.
IMPRESSION: No acute disease

## 2015-04-22 ENCOUNTER — Ambulatory Visit (INDEPENDENT_AMBULATORY_CARE_PROVIDER_SITE_OTHER): Payer: Managed Care, Other (non HMO)

## 2015-04-22 ENCOUNTER — Ambulatory Visit (INDEPENDENT_AMBULATORY_CARE_PROVIDER_SITE_OTHER): Payer: Managed Care, Other (non HMO) | Admitting: Family Medicine

## 2015-04-22 ENCOUNTER — Encounter: Payer: Self-pay | Admitting: Family Medicine

## 2015-04-22 VITALS — BP 160/101 | HR 75 | Ht 72.0 in | Wt 253.0 lb

## 2015-04-22 DIAGNOSIS — I10 Essential (primary) hypertension: Secondary | ICD-10-CM | POA: Diagnosis not present

## 2015-04-22 DIAGNOSIS — E119 Type 2 diabetes mellitus without complications: Secondary | ICD-10-CM | POA: Diagnosis not present

## 2015-04-22 DIAGNOSIS — R7611 Nonspecific reaction to tuberculin skin test without active tuberculosis: Secondary | ICD-10-CM | POA: Diagnosis not present

## 2015-04-22 DIAGNOSIS — E785 Hyperlipidemia, unspecified: Secondary | ICD-10-CM | POA: Diagnosis not present

## 2015-04-22 MED ORDER — LOVASTATIN 20 MG PO TABS: 20.0000 mg | ORAL_TABLET | Freq: Every day | ORAL | Status: DC

## 2015-04-22 MED ORDER — LISINOPRIL-HYDROCHLOROTHIAZIDE 20-25 MG PO TABS: 0.5000 | ORAL_TABLET | Freq: Every day | ORAL | Status: DC

## 2015-04-22 MED ORDER — METFORMIN HCL 500 MG PO TABS: 500.0000 mg | ORAL_TABLET | Freq: Two times a day (BID) | ORAL | Status: DC

## 2015-04-22 NOTE — Progress Notes (Signed)
CC: Zachary Hendrix is a 44 y.o. 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 He tells me is been well controlled in the past with both of these medications without any side effects.  Reports a history of type 2 diabetes found a few years ago when he went into diabetic ketoacidosis. He's been able to manage blood sugars down around the low 100s with metformin 500 mg twice a day however ran out of this medication many months ago after losing insurance and blood sugars are now ranging in the upper 100-200 range. No polyphagia polydipsia nor polyuria.  History of hyperlipidemia with unknown most recent LDL check. He's been on lovastatin in the past but has been off of this medication for at least 3 months due to running out of insurance. Denies any right upper quadrant pain or myalgias or intolerance all taking this medication. No known cardiovascular disease  He has a history of a positive PPD and received isoniazid for 6 months, he's had multiple chest x-rays the past, none of which have ever shown any signs of tuberculosis. Denies chronic cough and no history of hemoptysis.  When he was younger he had chickenpox in January 1984 a few years ago he had a varicella titer that showed immunity. He's had hepatitis vaccines in the past however he is uncertain of the dates and would like to just have a hepatitis antibody checked.  Review of Systems - General ROS: negative for - chills, fever, night sweats, weight gain or weight loss Ophthalmic ROS: negative for - decreased vision Psychological ROS: negative for - anxiety or depression ENT ROS: negative for - hearing change, nasal congestion, tinnitus or  allergies Hematological and Lymphatic ROS: negative for - bleeding problems, bruising or swollen lymph nodes Breast ROS: negative Respiratory ROS: no cough, shortness of breath, or wheezing Cardiovascular ROS: no chest pain or dyspnea on exertion Gastrointestinal ROS: no abdominal pain, change in bowel habits, or black or bloody stools Genito-Urinary ROS: negative for - genital discharge, genital ulcers, incontinence or abnormal bleeding from genitals Musculoskeletal ROS: negative for - joint pain or muscle pain Neurological ROS: negative for - headaches or memory loss Dermatological ROS: negative for lumps, mole changes, rash and skin lesion changes  Past Medical History  Diagnosis Date  . Hypertension   . Obesity   . Hyperlipidemia   . Diabetes mellitus     DKA 06-2010  . TIA (transient ischemic attack)   . Palpitations   . Metabolic syndrome   . TB (pulmonary tuberculosis)     history trated 2000, 12-2008, CXR normal    Past Surgical History  Procedure Laterality Date  . Foot surgery  1982    right  . Cholecystectomy  04-2004    lap   Family History  Problem Relation Age of Onset  . Glucose-6-phos deficiency Mother     impaired  . Joint hypermobility Mother     DJD  . Colon polyps Mother   . COPD Sister   . Hearing loss Son   . Cancer Maternal Aunt     colon  . Cancer Other     leukemia  . Cancer Other     multiple myeloma  History   Social History  . Marital Status: Single    Spouse Name: N/A  . Number of Children: N/A  . Years of Education: N/A   Occupational History  . Not on file.   Social History Main Topics  . Smoking status: Former Smoker -- 1.00 packs/day    Types: Cigarettes    Quit date: 05/03/2014  . Smokeless tobacco: Not on file  . Alcohol Use: No  . Drug Use: No  . 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    Other Topics Concern  . Not on file   Social History Narrative     Objective: BP 160/101 mmHg  Pulse 75  Ht 6' (1.829 m)  Wt 253 lb (114.76 kg)  BMI 34.31 kg/m2  General: Alert and Oriented, No Acute Distress HEENT: Pupils equal, round, reactive to light. Conjunctivae clear.  External ears unremarkable, canals clear with intact TMs with appropriate landmarks.  Middle ear appears open without effusion. Pink inferior turbinates.  Moist mucous membranes, pharynx without inflammation nor lesions.  Neck supple without palpable lymphadenopathy nor abnormal masses. Lungs: Clear to auscultation bilaterally, no wheezing/ronchi/rales.  Comfortable work of breathing. Good air movement. Cardiac: Regular rate and rhythm. Normal S1/S2.  No murmurs, rubs, nor gallops.   Abdomen: Obese and soft Extremities: No peripheral edema.  Strong peripheral pulses.  Mental Status: No depression, anxiety, nor agitation. Skin: Warm and dry.  Assessment & Plan: Zachary Hendrix was seen today for establish care.  Diagnoses and all orders for this visit:  Essential hypertension Orders: -     lisinopril-hydrochlorothiazide (PRINZIDE,ZESTORETIC) 20-25 MG per tablet; Take 0.5 tablets by mouth daily. -     Hepatitis B surface antibody  Type 2 diabetes mellitus without complication Orders: -     metFORMIN (GLUCOPHAGE) 500 MG tablet; Take 1 tablet (500 mg total) by mouth 2 (two) times daily with a meal. -     Hepatitis B surface antibody  Hyperlipidemia Orders: -     lovastatin (MEVACOR) 20 MG tablet; Take 1 tablet (20 mg total) by mouth at bedtime. -     Hepatitis B surface antibody  Positive PPD Orders: -     DG Chest 2 View; Future   Essential hypertension: Uncontrolled chronic condition restart lisinopril-hydrochlorothiazide Type 2 diabetes: Uncontrolled chronic condition restart metformin check A1c 2-3 months  hyperlipidemia: Restart Mevacor and check cholesterol 2-3 months  History of positive PPD,  checking chest x-ray needed by [employer]   Return in about 2 months (around 06/22/2015) for BP and DM follow up.

## 2015-04-23 LAB — HEPATITIS B SURFACE ANTIBODY,QUALITATIVE: HEP B S AB: NEGATIVE

## 2015-04-24 ENCOUNTER — Telehealth: Payer: Self-pay | Admitting: Family Medicine

## 2015-04-24 NOTE — Telephone Encounter (Signed)
Error

## 2015-04-29 ENCOUNTER — Other Ambulatory Visit: Payer: Self-pay

## 2015-04-29 ENCOUNTER — Telehealth: Payer: Self-pay | Admitting: Family Medicine

## 2015-04-29 MED ORDER — AMBULATORY NON FORMULARY MEDICATION: Status: AC

## 2015-04-29 NOTE — Telephone Encounter (Signed)
Rx sent to pharmacy   

## 2015-04-29 NOTE — Telephone Encounter (Signed)
Patient walked-in to pick up forms and request to know if he can have contour next test strips for blood glucose meter and if you can send to [facility] [place]. Patient stated he got a new meter. Thanks

## 2015-05-01 ENCOUNTER — Ambulatory Visit (INDEPENDENT_AMBULATORY_CARE_PROVIDER_SITE_OTHER): Payer: Managed Care, Other (non HMO) | Admitting: Family Medicine

## 2015-05-01 VITALS — BP 157/107 | HR 89 | Wt 255.0 lb

## 2015-05-01 DIAGNOSIS — Z23 Encounter for immunization: Secondary | ICD-10-CM | POA: Diagnosis not present

## 2015-05-01 NOTE — Progress Notes (Signed)
   Subjective:    Patient ID: Zachary Hendrix, male    DOB: 05-02-1971, 44 y.o.   MRN: [id number]  HPI Patient came into clinic for TB screening (Pt has tested positive in the past) and Tdap injection. These are required per the Pt's school, he is applying to the LPN nursing program.   Review of Systems     Objective:   Physical Exam        Assessment & Plan:  Patient has already completed the chest xray. I completed the TB screening today in office, he answered all questions "no." Made a copy to scan into chart. Patient tolerated Tdap injection in Left Deltoid well with no immediate complications. Patients BP was elevated today, both times. Patient states he didn't take any of this meds this am, he woke up late. Patient states his BP Rx was recently changed. Advised him to go home and take his Rx and come in next week for a nurse visit BP check to make sure they are working. Verbalized understanding. No further questions.

## 2015-05-04 ENCOUNTER — Ambulatory Visit: Payer: Managed Care, Other (non HMO)

## 2015-06-29 ENCOUNTER — Ambulatory Visit: Payer: Managed Care, Other (non HMO) | Admitting: Family Medicine

## 2016-12-04 IMAGING — CR DG CHEST 2V
2 series · 2 of 2 positions shown · non-contrast
Comparison: Chest x-ray 01/17/2011

CLINICAL DATA: Positive PPD many years ago

EXAM:
CHEST  2 VIEW

[chest pa]
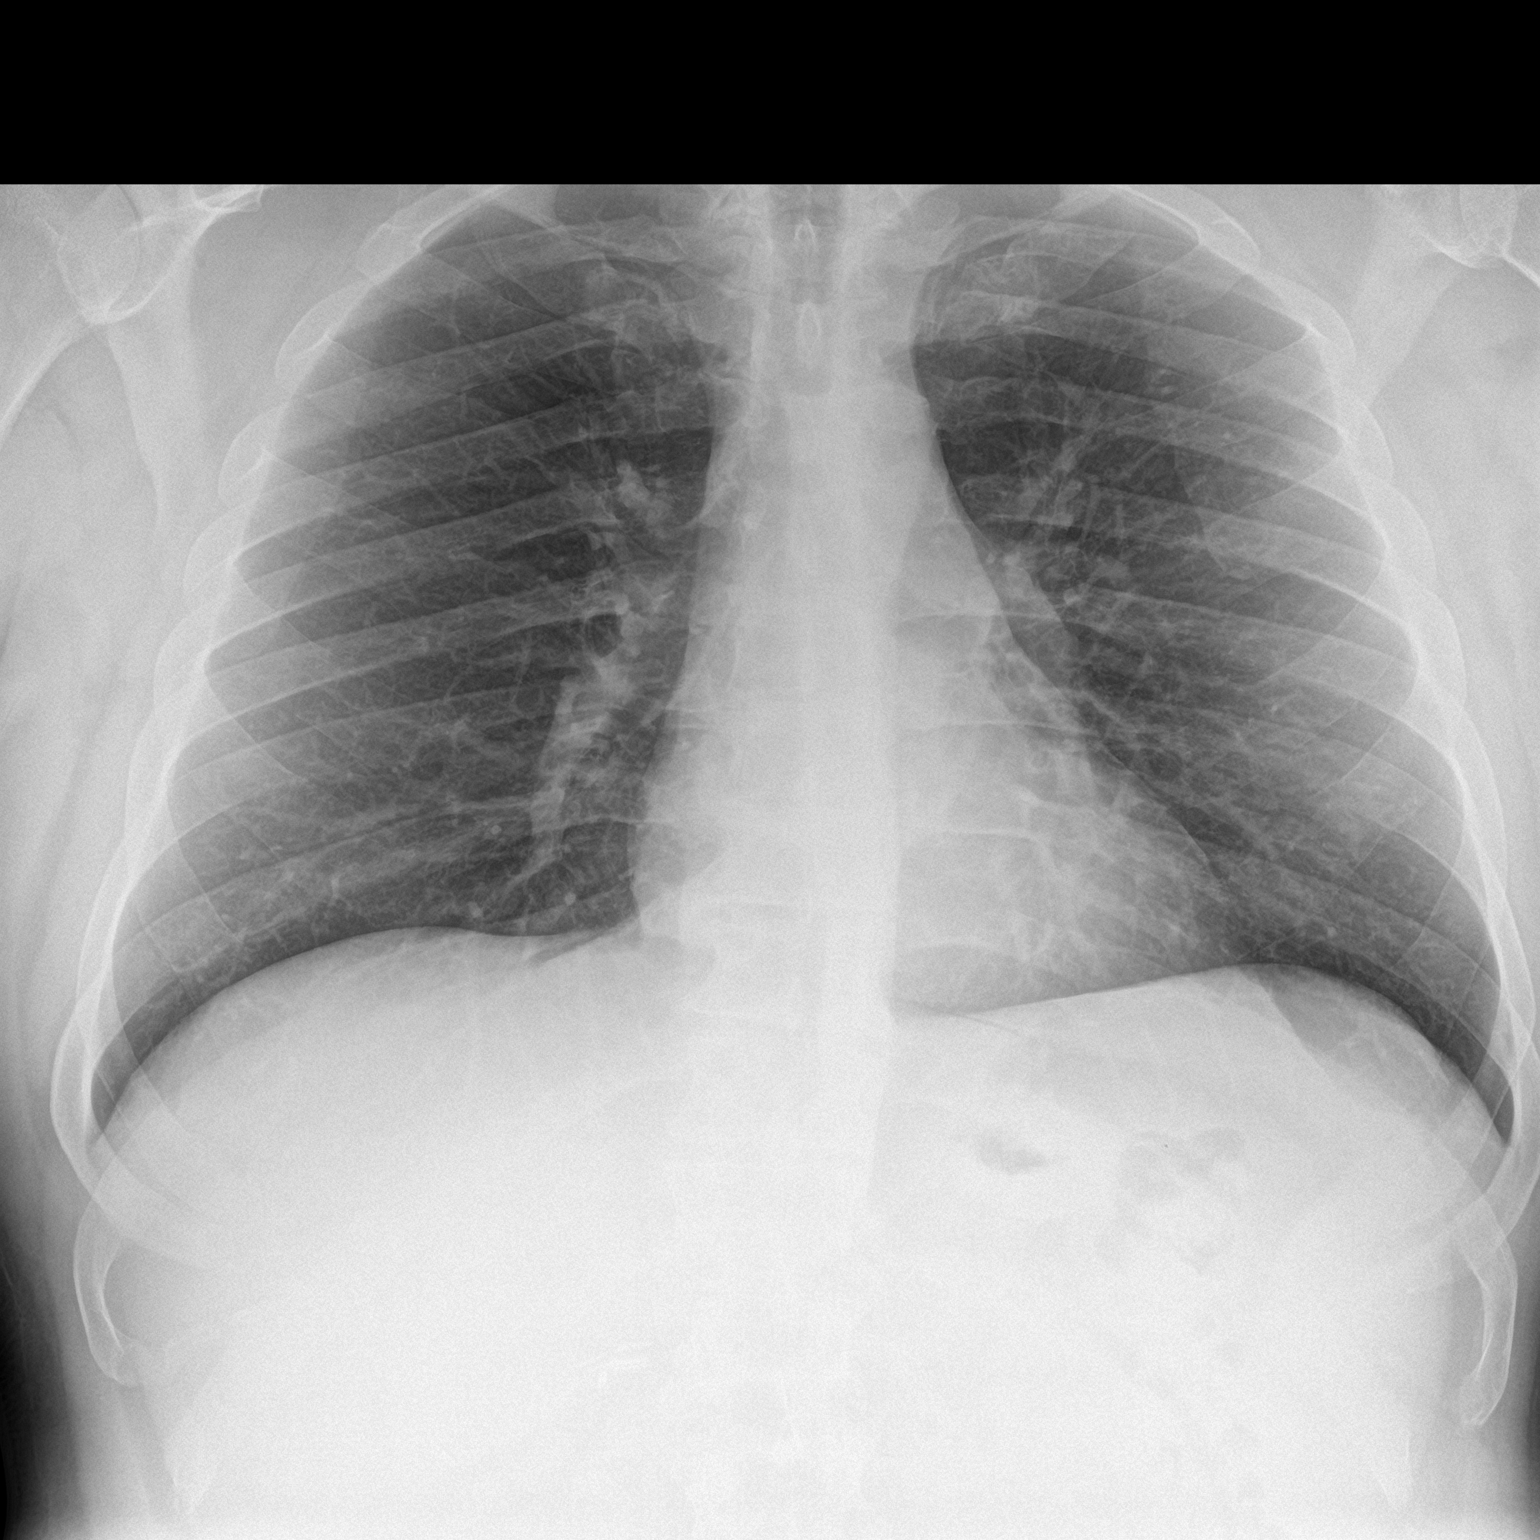

[chest lat]
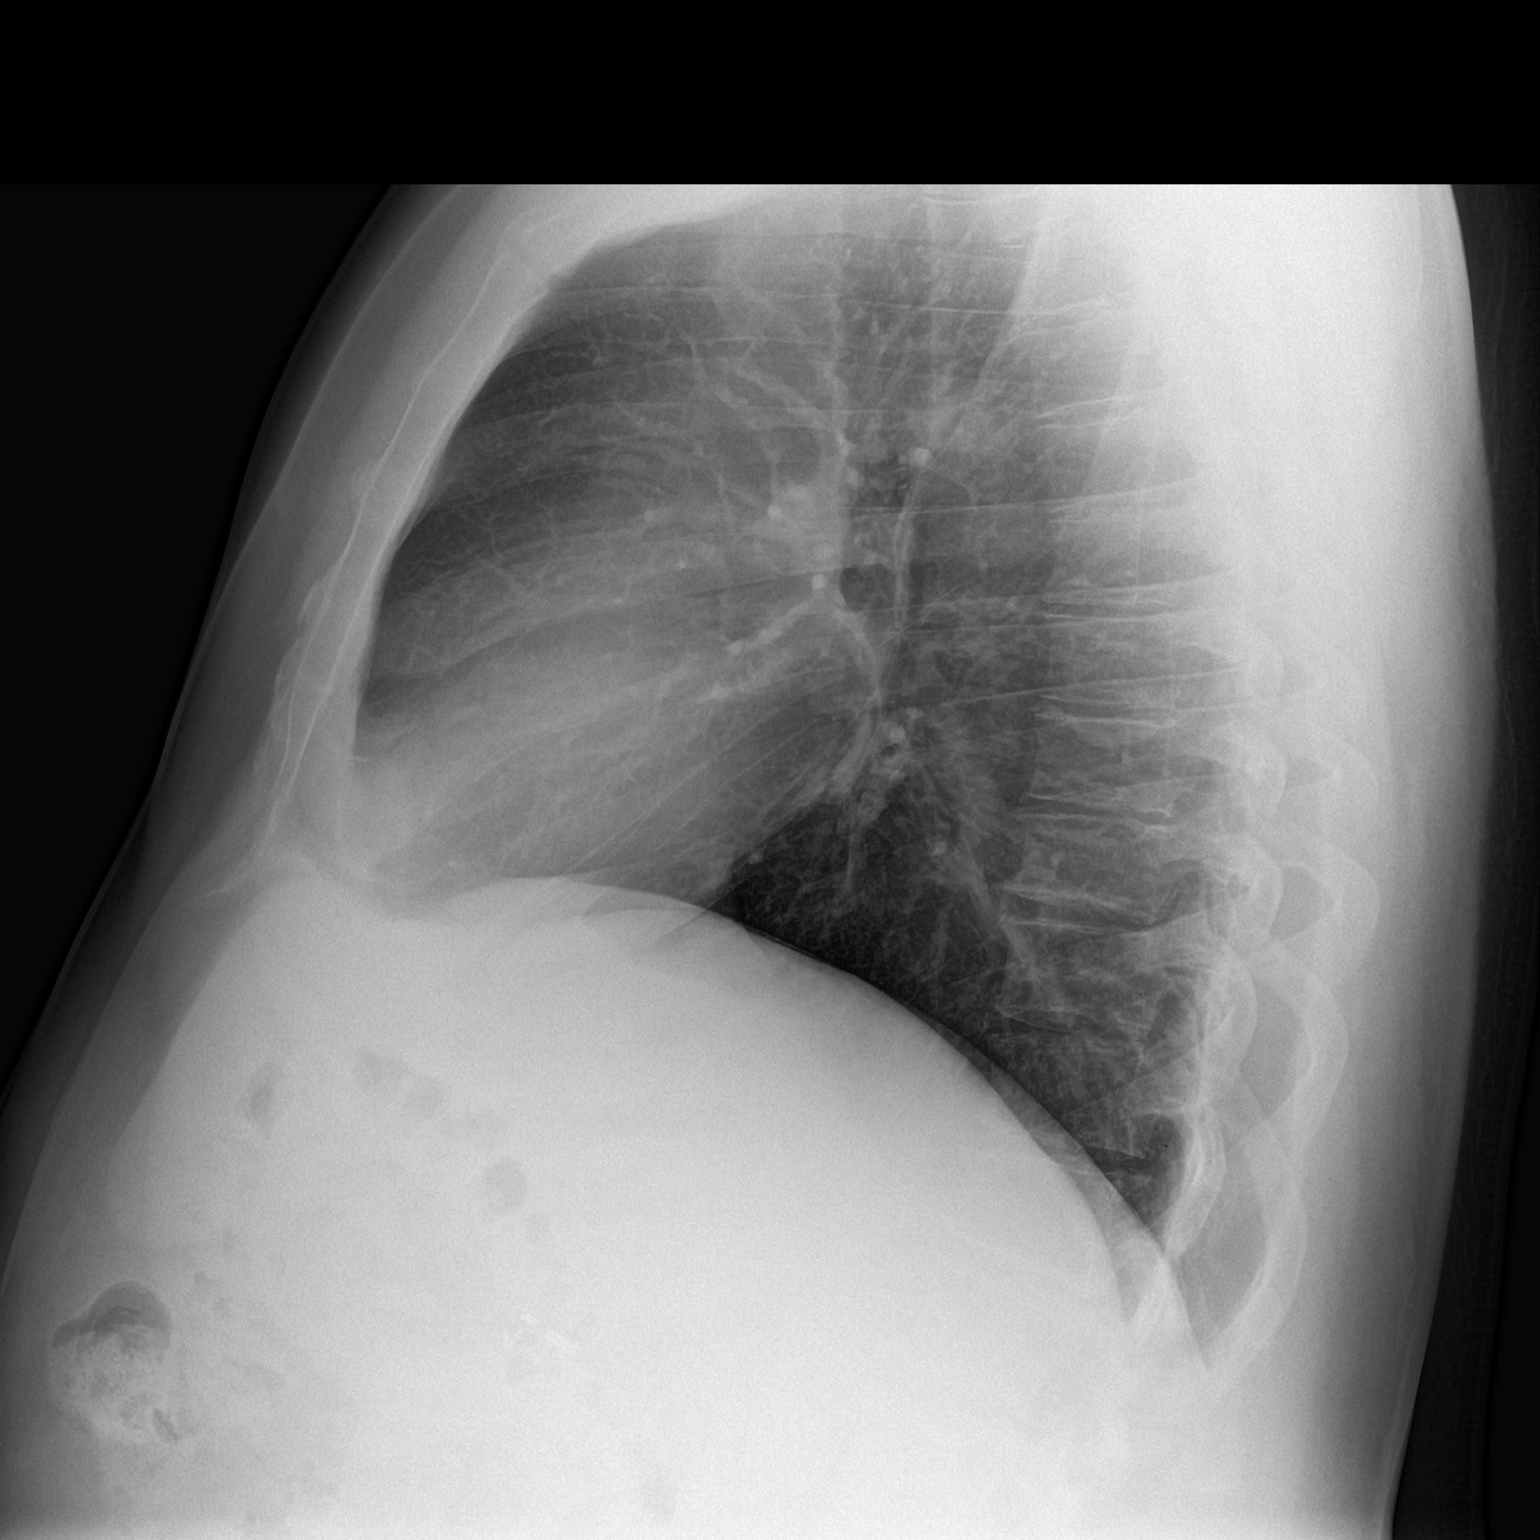

[2 of 2 positions shown; findings below may reference images not displayed]

FINDINGS: No active infiltrate or effusion is seen. Mediastinal and hilar
contours are unremarkable. The heart is within normal limits in
size. No bony abnormality is seen.
IMPRESSION: No active cardiopulmonary disease.

## 2017-05-10 ENCOUNTER — Ambulatory Visit (INDEPENDENT_AMBULATORY_CARE_PROVIDER_SITE_OTHER): Payer: 59 | Admitting: Physician Assistant

## 2017-05-10 ENCOUNTER — Encounter: Payer: Self-pay | Admitting: Physician Assistant

## 2017-05-10 VITALS — BP 162/108 | HR 89 | Temp 97.9°F | Ht 72.0 in | Wt 236.0 lb

## 2017-05-10 DIAGNOSIS — Z716 Tobacco abuse counseling: Secondary | ICD-10-CM

## 2017-05-10 DIAGNOSIS — Z720 Tobacco use: Secondary | ICD-10-CM

## 2017-05-10 DIAGNOSIS — H0520 Unspecified exophthalmos: Secondary | ICD-10-CM

## 2017-05-10 DIAGNOSIS — E1165 Type 2 diabetes mellitus with hyperglycemia: Secondary | ICD-10-CM | POA: Insufficient documentation

## 2017-05-10 DIAGNOSIS — Z6832 Body mass index (BMI) 32.0-32.9, adult: Secondary | ICD-10-CM

## 2017-05-10 DIAGNOSIS — E7800 Pure hypercholesterolemia, unspecified: Secondary | ICD-10-CM | POA: Diagnosis not present

## 2017-05-10 DIAGNOSIS — Z131 Encounter for screening for diabetes mellitus: Secondary | ICD-10-CM

## 2017-05-10 DIAGNOSIS — E559 Vitamin D deficiency, unspecified: Secondary | ICD-10-CM

## 2017-05-10 DIAGNOSIS — R7989 Other specified abnormal findings of blood chemistry: Secondary | ICD-10-CM

## 2017-05-10 DIAGNOSIS — I10 Essential (primary) hypertension: Secondary | ICD-10-CM

## 2017-05-10 DIAGNOSIS — Z0000 Encounter for general adult medical examination without abnormal findings: Secondary | ICD-10-CM | POA: Diagnosis not present

## 2017-05-10 DIAGNOSIS — R5383 Other fatigue: Secondary | ICD-10-CM

## 2017-05-10 DIAGNOSIS — F17200 Nicotine dependence, unspecified, uncomplicated: Secondary | ICD-10-CM | POA: Insufficient documentation

## 2017-05-10 DIAGNOSIS — R760 Raised antibody titer: Secondary | ICD-10-CM

## 2017-05-10 DIAGNOSIS — IMO0002 Reserved for concepts with insufficient information to code with codable children: Secondary | ICD-10-CM | POA: Insufficient documentation

## 2017-05-10 DIAGNOSIS — E119 Type 2 diabetes mellitus without complications: Secondary | ICD-10-CM

## 2017-05-10 DIAGNOSIS — E781 Pure hyperglyceridemia: Secondary | ICD-10-CM

## 2017-05-10 DIAGNOSIS — Z8349 Family history of other endocrine, nutritional and metabolic diseases: Secondary | ICD-10-CM

## 2017-05-10 MED ORDER — LISINOPRIL-HYDROCHLOROTHIAZIDE 20-25 MG PO TABS: 1.0000 | ORAL_TABLET | Freq: Every day | ORAL | 1 refills | Status: DC

## 2017-05-10 MED ORDER — NICOTINE 21 MG/24HR TD PT24
21.0000 mg | MEDICATED_PATCH | Freq: Every day | TRANSDERMAL | 0 refills | Status: DC
Start: 2017-05-10 — End: 2017-05-26

## 2017-05-10 NOTE — Progress Notes (Signed)
Subjective:    Patient ID: FIDENCIO DUDDY, male    DOB: [birth year], 46 y.o.   MRN: [id number]  HPI  Pt is a 46 yo male who presents to the clinic to get forms filled out for school and get back on medications. He has been out of medication for over a year. He continues to smoke daily. He does desire to quite. He tried chantix but made him very depressed.   HTN- not checking his BP. Not taking his medication. Denies any CP, palpitations, vision changes or headaches.   DM- not taking metformin. Not checking sugars. Not keeping to a diuretic diet.   .. Active Ambulatory Problems    Diagnosis Date Noted  . OBESITY, UNSPECIFIED 12/30/2008  . Tobacco use disorder 05/25/2011  . Essential hypertension 04/22/2015  . Type 2 diabetes mellitus ([place]) 04/22/2015  . Hyperlipidemia 04/22/2015  . 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 Uncontrolled diabetes mellitus ([place]) 05/10/2017  . Encounter for smoking cessation counseling 05/10/2017  . Hypertriglyceridemia 05/12/2017  . Decreased testosterone level 05/12/2017  . Vitamin D deficiency 05/12/2017  . Exophthalmos 05/12/2017  . No energy 05/12/2017   Resolved Ambulatory Problems    Diagnosis Date Noted  . No Resolved Ambulatory Problems   Past Medical History:  Diagnosis Date  . Diabetes mellitus   . Hyperlipidemia   . Hypertension   . Metabolic syndrome   . Obesity   . Palpitations   . TB (pulmonary tuberculosis)   . TIA (transient ischemic attack)    .Marland Kitchen Family History  Problem Relation Age of Onset  . Glucose-6-phos deficiency Mother        impaired  . Joint hypermobility Mother        DJD  . Colon polyps Mother   . COPD Sister   . Hearing loss Son   . Cancer Maternal Aunt        colon  . Cancer Other        leukemia  . Cancer Other        multiple myeloma       Review of Systems  All other systems reviewed and are negative.     Objective:   Physical Exam  Constitutional: He is oriented to person, place, and time. He  appears well-developed and well-nourished.  HENT:  Head: Normocephalic and atraumatic.  Right Ear: External ear normal.  Left Ear: External ear normal.  Nose: Nose normal.  Mouth/Throat: Oropharynx is clear and moist. No oropharyngeal exudate.  Eyes: Conjunctivae are normal. Right eye exhibits no discharge. Left eye exhibits no discharge.  Bulging bilateral eyes.   Neck: Normal range of motion. Neck supple. No thyromegaly present.  Cardiovascular: Normal rate, regular rhythm and normal heart sounds.   Pulmonary/Chest: Effort normal and breath sounds normal. He has no wheezes.  Abdominal: Soft. Bowel sounds are normal. He exhibits no distension and no mass. There is no tenderness. There is no rebound and no guarding.  Neurological: He is alert and oriented to person, place, and time.  Psychiatric: He has a normal mood and affect. His behavior is normal.          Assessment & Plan:  .Marland KitchenJaquise was seen today for hyperlipidemia, hypertension and diabetes.  Diagnoses and all orders for this visit:  Routine physical examination -     Lipid panel -     COMPLETE METABOLIC PANEL WITH GFR -     TSH -     Thyroid antibodies -     T4,  free  Essential hypertension -     COMPLETE METABOLIC PANEL WITH GFR -     lisinopril-hydrochlorothiazide (PRINZIDE,ZESTORETIC) 20-25 MG tablet; Take 1 tablet by mouth daily.  Pure hypercholesterolemia -     Lipid panel -     lovastatin (MEVACOR) 20 MG tablet; Take 1 tablet (20 mg total) by mouth at bedtime.  BMI 32.0-32.9,adult  Screening for diabetes mellitus -     COMPLETE METABOLIC PANEL WITH GFR  Family history of thyroid disease -     TSH -     Thyroid antibodies -     T4, free  Exophthalmos -     TSH -     Thyroid antibodies -     T4, free  No energy -     Testosterone -     VITAMIN D 25 Hydroxy (Vit-D Deficiency, Fractures) -     B12 -     CBC  Encounter for smoking cessation counseling -     nicotine (NICODERM CQ - DOSED IN  MG/24 HOURS) 21 mg/24hr patch; Place 1 patch (21 mg total) onto the skin daily.  High serum varicella zoster virus (VZV) antibody titer -     Varicella zoster antibody, IgG  Hypertriglyceridemia  Decreased testosterone level  Vitamin D deficiency  Diabetes mellitus without complication (HCC) -     sitaGLIPtin-metformin (JANUMET) 50-1000 MG tablet; Take 1 tablet by mouth 2 (two) times daily with a meal.   Form filled out for school.  Start patches for smoking cessation. Follow up in 1 month.  Medications restarted.encouraged low salt diet.

## 2017-05-11 LAB — COMPLETE METABOLIC PANEL WITH GFR
ALBUMIN: 3.9 g/dL (ref 3.6–5.1)
ALK PHOS: 134 U/L — AB (ref 40–115)
ALT: 39 U/L (ref 9–46)
AST: 25 U/L (ref 10–40)
BILIRUBIN TOTAL: 0.4 mg/dL (ref 0.2–1.2)
BUN: 12 mg/dL (ref 7–25)
CO2: 22 mmol/L (ref 20–31)
CREATININE: 0.85 mg/dL (ref 0.60–1.35)
Calcium: 9.1 mg/dL (ref 8.6–10.3)
Chloride: 102 mmol/L (ref 98–110)
GLUCOSE: 223 mg/dL — AB (ref 65–99)
Potassium: 4.6 mmol/L (ref 3.5–5.3)
SODIUM: 136 mmol/L (ref 135–146)
TOTAL PROTEIN: 6.9 g/dL (ref 6.1–8.1)

## 2017-05-11 LAB — LIPID PANEL
Cholesterol: 177 mg/dL (ref <200)
HDL: 20 mg/dL — AB (ref >40)
LDL Cholesterol: 106 mg/dL — ABNORMAL HIGH (ref <100)
TRIGLYCERIDES: 256 mg/dL — AB (ref <150)
Total CHOL/HDL Ratio: 8.9 Ratio — ABNORMAL HIGH (ref <5.0)
VLDL: 51 mg/dL — ABNORMAL HIGH (ref <30)

## 2017-05-11 LAB — CBC
HCT: 47.9 % (ref 38.5–50.0)
Hemoglobin: 16.8 g/dL (ref 13.2–17.1)
MCH: 31.7 pg (ref 27.0–33.0)
MCHC: 35.1 g/dL (ref 32.0–36.0)
MCV: 90.4 fL (ref 80.0–100.0)
MPV: 9.5 fL (ref 7.5–12.5)
PLATELETS: 203 10*3/uL (ref 140–400)
RBC: 5.3 MIL/uL (ref 4.20–5.80)
RDW: 13.5 % (ref 11.0–15.0)
WBC: 8.8 10*3/uL (ref 3.8–10.8)

## 2017-05-11 LAB — T4, FREE: FREE T4: 1.2 ng/dL (ref 0.8–1.8)

## 2017-05-11 LAB — TSH: TSH: 1.12 mIU/L (ref 0.40–4.50)

## 2017-05-12 DIAGNOSIS — H0520 Unspecified exophthalmos: Secondary | ICD-10-CM | POA: Insufficient documentation

## 2017-05-12 DIAGNOSIS — E781 Pure hyperglyceridemia: Secondary | ICD-10-CM | POA: Insufficient documentation

## 2017-05-12 DIAGNOSIS — R7989 Other specified abnormal findings of blood chemistry: Secondary | ICD-10-CM | POA: Insufficient documentation

## 2017-05-12 DIAGNOSIS — R5383 Other fatigue: Secondary | ICD-10-CM | POA: Insufficient documentation

## 2017-05-12 DIAGNOSIS — E559 Vitamin D deficiency, unspecified: Secondary | ICD-10-CM | POA: Insufficient documentation

## 2017-05-12 LAB — VARICELLA ZOSTER ANTIBODY, IGG: Varicella IgG: >4000 Index — ABNORMAL HIGH (ref <135.00)

## 2017-05-12 LAB — VITAMIN D 25 HYDROXY (VIT D DEFICIENCY, FRACTURES): VIT D 25 HYDROXY: 16 ng/mL — AB (ref 30–100)

## 2017-05-12 LAB — VITAMIN B12: VITAMIN B 12: 552 pg/mL (ref 200–1100)

## 2017-05-12 LAB — THYROID ANTIBODIES: Thyroperoxidase Ab SerPl-aCnc: <1 IU/mL (ref <9)

## 2017-05-12 LAB — TESTOSTERONE: Testosterone: 259 ng/dL (ref 250–827)

## 2017-05-12 MED ORDER — SITAGLIPTIN PHOS-METFORMIN HCL 50-1000 MG PO TABS: 1.0000 | ORAL_TABLET | Freq: Two times a day (BID) | ORAL | 2 refills | Status: DC

## 2017-05-12 MED ORDER — LOVASTATIN 20 MG PO TABS: 20.0000 mg | ORAL_TABLET | Freq: Every day | ORAL | 3 refills | Status: DC

## 2017-05-12 NOTE — Progress Notes (Signed)
Call pt: vitamin D is low. Encouraged 2000 units of D3 daily.  Testosterone low normal. I would consider OTC testosterone booster and recheck in 3 months.  B12 great.  Immune to varicella.  TG elevated. Start fish oil 4000mg  daily.  Glucose very elevated. I am going to put you on a combination pill. Recheck A!C in 3 months.

## 2017-05-17 ENCOUNTER — Telehealth: Payer: Self-pay | Admitting: *Deleted

## 2017-05-17 NOTE — Telephone Encounter (Signed)
[id number] Pre Authorization sent to cover my meds.

## 2017-05-22 NOTE — Telephone Encounter (Signed)
[facility] and they denied coverage on Janumet due to patient did not meet following criteria:  1 History of 3 month trial resulting in therapeutic failure, contraindication or intolerance to Jentadueto 2. History of 3 month trial resulting in therapeutic failure, contraindication or intolerance to Barnett AbuKazano, Kombiglyze XR  File ID: [id number]   -CF

## 2017-05-26 ENCOUNTER — Ambulatory Visit (INDEPENDENT_AMBULATORY_CARE_PROVIDER_SITE_OTHER): Payer: 59 | Admitting: Physician Assistant

## 2017-05-26 ENCOUNTER — Encounter: Payer: Self-pay | Admitting: Physician Assistant

## 2017-05-26 VITALS — BP 142/84 | HR 86 | Ht 72.0 in | Wt 237.0 lb

## 2017-05-26 DIAGNOSIS — Z716 Tobacco abuse counseling: Secondary | ICD-10-CM

## 2017-05-26 DIAGNOSIS — I10 Essential (primary) hypertension: Secondary | ICD-10-CM | POA: Diagnosis not present

## 2017-05-26 DIAGNOSIS — Z720 Tobacco use: Secondary | ICD-10-CM | POA: Diagnosis not present

## 2017-05-26 DIAGNOSIS — E118 Type 2 diabetes mellitus with unspecified complications: Secondary | ICD-10-CM

## 2017-05-26 LAB — POCT GLYCOSYLATED HEMOGLOBIN (HGB A1C): Hemoglobin A1C: 8.3

## 2017-05-26 MED ORDER — METFORMIN HCL 1000 MG PO TABS: 1000.0000 mg | ORAL_TABLET | Freq: Two times a day (BID) | ORAL | 0 refills | Status: DC

## 2017-05-26 MED ORDER — NICOTINE 7 MG/24HR TD PT24: 7.0000 mg | MEDICATED_PATCH | Freq: Every day | TRANSDERMAL | 0 refills | Status: DC

## 2017-05-26 MED ORDER — NICOTINE 14 MG/24HR TD PT24: 14.0000 mg | MEDICATED_PATCH | Freq: Every day | TRANSDERMAL | 0 refills | Status: DC

## 2017-05-26 MED ORDER — SITAGLIPTIN PHOSPHATE 100 MG PO TABS: 100.0000 mg | ORAL_TABLET | Freq: Every day | ORAL | 0 refills | Status: DC

## 2017-05-26 MED ORDER — NICOTINE 21 MG/24HR TD PT24: 21.0000 mg | MEDICATED_PATCH | Freq: Every day | TRANSDERMAL | 0 refills | Status: DC

## 2017-05-26 NOTE — Progress Notes (Signed)
Subjective:    Patient ID: Zachary Hendrix, male    DOB: 02/17/1971, 46 y.o.   MRN: [id number]  HPI  Pt is a 46 yo male who presents to the clinic to follow up on smoking cessation. It has been one month since starting patches. He is at the highest dose of patches and cut down from 1 pack a day to 1 to 2 cigs a day. He feels like he is making a lot of progress. He finds he has the urge to smoke the most when he is stressed.   HTN- he is taking BP medication but has not taken today yet. He ususally takes around noon. Hendrix CP, palpitations, headaches, or vision changes.   DM- he is not taking medication due to cost. He would like a more affordable option. He is not checking his sugars. He denies any vision changes, ulcers, sores, hypoglycemic events. He is trying to eat better and decrease carbs and sugars.   .. Active Ambulatory Problems    Diagnosis Date Noted  . OBESITY, UNSPECIFIED 12/30/2008  . Tobacco use disorder 05/25/2011  . Essential hypertension 04/22/2015  . Type 2 diabetes mellitus (HCC) 04/22/2015  . Hyperlipidemia 04/22/2015  . 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 Uncontrolled diabetes mellitus (HCC) 05/10/2017  . Encounter for smoking cessation counseling 05/10/2017  . Hypertriglyceridemia 05/12/2017  . Decreased testosterone level 05/12/2017  . Vitamin D deficiency 05/12/2017  . Exophthalmos 05/12/2017  . Hendrix energy 05/12/2017   Resolved Ambulatory Problems    Diagnosis Date Noted  . Hendrix Resolved Ambulatory Problems   Past Medical History:  Diagnosis Date  . Diabetes mellitus   . Hyperlipidemia   . Hypertension   . Metabolic syndrome   . Obesity   . Palpitations   . TB (pulmonary tuberculosis)   . TIA (transient ischemic attack)       Review of Systems  All other systems reviewed and are negative.      Objective:   Physical Exam  Constitutional: He is oriented to person, place, and time. He appears well-developed and well-nourished.  HENT:  Head:  Normocephalic and atraumatic.  Cardiovascular: Normal rate, regular rhythm and normal heart sounds.   Pulmonary/Chest: Effort normal and breath sounds normal.  Neurological: He is alert and oriented to person, place, and time.  Psychiatric: He has a normal mood and affect. His behavior is normal.          Assessment & Plan:  Marland KitchenMarland KitchenDiagnoses and all orders for this visit:  Essential hypertension  Encounter for smoking cessation counseling -     nicotine (NICODERM CQ - DOSED IN MG/24 HOURS) 21 mg/24hr patch; Place 1 patch (21 mg total) onto the skin daily. -     nicotine (NICODERM CQ) 14 mg/24hr patch; Place 1 patch (14 mg total) onto the skin daily. -     nicotine (NICODERM CQ) 7 mg/24hr patch; Place 1 patch (7 mg total) onto the skin daily.  Type 2 diabetes mellitus with complication, without long-term current use of insulin (HCC) -     POCT HgB A1C -     metFORMIN (GLUCOPHAGE) 1000 MG tablet; Take 1 tablet (1,000 mg total) by mouth 2 (two) times daily with a meal. -     sitaGLIPtin (JANUVIA) 100 MG tablet; Take 1 tablet (100 mg total) by mouth daily.   2nd recheck BP better. Pt agrees to watch salt intake. Try to lose weight and keep eye on BP at work. If not below 130/80 at  next visit will add norvasc to plan to get to goal.   Given different dose of patches. Encouraged patient needs to stop smoking on 21mg  first. If having craving consider nicorette gum for craving with patches. Follow up in 3 months.   A!C elevated but not on medications. seperated [place]uvia and metformin. If not affordable please call. Discussed side effects.  Discussed diabetic diet.  On ACE.  On STATin.  Encouraged eye exam.  Declined pneumonia shot today.  Follow up in 3 months.    .. Lab Results  Component Value Date   HGBA1C 8.3 05/26/2017

## 2017-06-01 ENCOUNTER — Telehealth: Payer: Self-pay | Admitting: *Deleted

## 2017-06-01 NOTE — Telephone Encounter (Signed)
Pre Authorization sent to cover my meds. [id number] - PA Case ID: [id number]

## 2017-06-06 NOTE — Telephone Encounter (Signed)
Denied. Letter in provider's box 

## 2017-06-07 ENCOUNTER — Ambulatory Visit: Payer: 59 | Admitting: Physician Assistant

## 2018-03-16 ENCOUNTER — Encounter: Payer: Self-pay | Admitting: Physician Assistant

## 2018-03-16 ENCOUNTER — Ambulatory Visit (INDEPENDENT_AMBULATORY_CARE_PROVIDER_SITE_OTHER): Payer: Self-pay | Admitting: Physician Assistant

## 2018-03-16 VITALS — BP 158/90 | HR 89 | Ht 72.0 in | Wt 232.0 lb

## 2018-03-16 DIAGNOSIS — B353 Tinea pedis: Secondary | ICD-10-CM

## 2018-03-16 DIAGNOSIS — F17200 Nicotine dependence, unspecified, uncomplicated: Secondary | ICD-10-CM

## 2018-03-16 DIAGNOSIS — I10 Essential (primary) hypertension: Secondary | ICD-10-CM

## 2018-03-16 DIAGNOSIS — B351 Tinea unguium: Secondary | ICD-10-CM | POA: Insufficient documentation

## 2018-03-16 DIAGNOSIS — E1165 Type 2 diabetes mellitus with hyperglycemia: Secondary | ICD-10-CM

## 2018-03-16 DIAGNOSIS — E118 Type 2 diabetes mellitus with unspecified complications: Secondary | ICD-10-CM

## 2018-03-16 DIAGNOSIS — E7800 Pure hypercholesterolemia, unspecified: Secondary | ICD-10-CM

## 2018-03-16 LAB — POCT GLYCOSYLATED HEMOGLOBIN (HGB A1C): Hemoglobin A1C: 10.7

## 2018-03-16 MED ORDER — METFORMIN HCL 1000 MG PO TABS: 1000.0000 mg | ORAL_TABLET | Freq: Two times a day (BID) | ORAL | 1 refills | Status: AC

## 2018-03-16 MED ORDER — SITAGLIPTIN PHOSPHATE 100 MG PO TABS: 100.0000 mg | ORAL_TABLET | Freq: Every day | ORAL | 1 refills | Status: AC

## 2018-03-16 MED ORDER — LISINOPRIL-HYDROCHLOROTHIAZIDE 20-25 MG PO TABS: 1.0000 | ORAL_TABLET | Freq: Every day | ORAL | 1 refills | Status: AC

## 2018-03-16 MED ORDER — LOVASTATIN 20 MG PO TABS: 20.0000 mg | ORAL_TABLET | Freq: Every day | ORAL | 3 refills | Status: AC

## 2018-03-16 NOTE — Progress Notes (Signed)
Subjective:    Patient ID: Zachary Hendrix, male    DOB: 12-17-1971, 47 y.o.   MRN: [id number]  HPI Zachary Hendrix is a 47 yo male  presents today to get a TB form for school. He states that he has not been taking his medications for about a year due to costs. He denies any chest pain, headaches, blurry vision, shortness of breath, edema. He states that his diet has been extremely poor consisting of fast food and freezer meals (occasionally cooks some protein and vegetables). 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  He denies noticing any feet ulcers but he has some abnormal appearing areas that has nearly cleared up with anti-fungal creams. He had some associated burning sensation with that but that has improved as the infection has cleared  .Marland Kitchen Active Ambulatory Problems    Diagnosis Date Noted  . OBESITY, UNSPECIFIED 12/30/2008  . Tobacco use disorder 05/25/2011  . Essential hypertension 04/22/2015  . Type 2 diabetes mellitus (HCC) 04/22/2015  . Hyperlipidemia 04/22/2015  . 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 Uncontrolled diabetes mellitus (HCC) 05/10/2017  . Current smoker 05/10/2017  . Hypertriglyceridemia 05/12/2017  . Decreased testosterone level 05/12/2017  . Vitamin D deficiency 05/12/2017  . Exophthalmos 05/12/2017  . No energy 05/12/2017  . Toenail fungus 03/16/2018   Resolved Ambulatory Problems    Diagnosis Date Noted  . No Resolved Ambulatory Problems   Past Medical History:  Diagnosis Date  . Diabetes mellitus   . Hyperlipidemia   . Hypertension   . Metabolic syndrome   . Obesity   . Palpitations   . TB (pulmonary tuberculosis)   . TIA (transient ischemic attack)       Review of Systems  Constitutional: Negative for activity change, appetite change and fatigue.  Eyes: Negative for visual disturbance.  Respiratory: Negative for cough, chest tightness and shortness of  breath.   Cardiovascular: Negative for chest pain, palpitations and leg swelling.  Gastrointestinal: Negative for diarrhea, nausea and vomiting.  Musculoskeletal: Negative for joint swelling and myalgias.  Skin: Negative for color change and wound.  Neurological: Negative for dizziness and headaches.       Objective:   Physical Exam  Constitutional: He is oriented to person, place, and time. He appears well-developed and well-nourished.  HENT:  Head: Normocephalic and atraumatic.  Right Ear: External ear normal.  Left Ear: External ear normal.  Eyes: Conjunctivae are normal.  Cardiovascular: Normal rate, regular rhythm and normal heart sounds.  Pulmonary/Chest: Effort normal and breath sounds normal. No respiratory distress.  Musculoskeletal: Normal range of motion.  Neurological: He is alert and oriented to person, place, and time.  Skin: Skin is warm and dry.  Psychiatric: He has a normal mood and affect. His behavior is normal.  Nursing note and vitals reviewed.         Assessment & Plan:  Marland KitchenMarland KitchenDiagnoses and all orders for this visit:  Uncontrolled type 2 diabetes mellitus with hyperglycemia (HCC) -     POCT HgB A1C  Type 2 diabetes mellitus with complication, without long-term current use of insulin (HCC) -     metFORMIN (GLUCOPHAGE) 1000 MG tablet; Take 1 tablet (1,000 mg total) by mouth 2 (two) times daily with a meal. -     sitaGLIPtin (JANUVIA) 100 MG tablet; Take 1 tablet (100 mg total) by mouth daily.  Pure hypercholesterolemia -  lovastatin (MEVACOR) 20 MG tablet; Take 1 tablet (20 mg total) by mouth at bedtime.  Essential hypertension -     lisinopril-hydrochlorothiazide (PRINZIDE,ZESTORETIC) 20-25 MG tablet; Take 1 tablet by mouth daily.  Current smoker  Tinea pedis of both feet   .Marland Kitchen. Depression screen [facility] 03/16/2018 05/10/2017  Decreased Interest 0 0  Down, Depressed, Hopeless 0 0  PHQ - 2 Score 0 0   .Marland Kitchen. Results for orders placed or performed  in visit on 03/16/18  POCT HgB A1C  Result Value Ref Range   Hemoglobin A1C 10.7    Restart all medications.  Report back via mychart in one month with BP reading.  Keep record of fasting sugars goal being 100-120. Call back if not to goal in 3 months.   Discussed diabetic diet.  Continue with OTC antifungal for fungus.  .. Diabetic Foot Exam - Simple   Simple Foot Form Visual Inspection No deformities, no ulcerations, no other skin breakdown bilaterally:  Yes Sensation Testing Intact to touch and monofilament testing bilaterally:  Yes Pulse Check Posterior Tibialis and Dorsalis pulse intact bilaterally:  Yes Comments    Pt declines all preventative vaccines, labs due to cost.   For NOW due to no insurance follow up in 6 months. Sooner if insurance is sooner.   Dicussed importance of managing DM, HTN, Hyperlipidemia and overall health.   Marland Kitchen..Spent 30 minutes with patient and greater than 50 percent of visit spent counseling patient regarding treatment plan.

## 2018-04-10 ENCOUNTER — Telehealth: Payer: Self-pay | Admitting: Physician Assistant

## 2018-04-10 NOTE — Telephone Encounter (Signed)
I don't see a letter in the chart (under letters or communications). There are a few scanned documents (in media) from about that time - these are not things we just have templates for, this looks like something he brought in to be filled out. We can certainly give him copies of previous records or forms, but f he can't be specific about what form is needed now, I'm not clear on how to help him.

## 2018-04-10 NOTE — Telephone Encounter (Signed)
I can't find anything about CXR every 5 years. I'm trying to find guidelines somewhere but I'm not finding much and I'm also seeing patients now. Can we call down to [facility] and ask about this issue? Basically, how often after (+)PPD and neg CXR does CXR need to be repeated per OSHA standards or infectious disease guidelines? The doc down there may know the answer better than I do so we can get this done.

## 2018-04-10 NOTE — Telephone Encounter (Signed)
At any rate, if all he needs is the questionnaire stating he is asymptomatic, that's fine we can fill that out, he needs to sign it. I can't find a record even of the first positive PPD. He needs an office visit if this form is not adequate. Don't worry about calling [facility]. Thanks.

## 2018-04-10 NOTE — Telephone Encounter (Signed)
Left VM with status update for Pt.  

## 2018-04-10 NOTE — Telephone Encounter (Signed)
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 Pt states he tested positive for TB in the past, so he is required to get chest xray's every 5 years. At last OV with PCP, the printed copy from 2016 was given to him but his school wants a new form (not new xray). Will route to a Provider in office for review.   He does not have a form to be completed. He wants us to use the same template from 2016.

## 2018-04-17 NOTE — Telephone Encounter (Signed)
I saw form scanned it. This appears to be handled.

## 2018-04-26 ENCOUNTER — Telehealth: Payer: Self-pay

## 2018-04-26 NOTE — Telephone Encounter (Signed)
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

## 2018-04-30 ENCOUNTER — Encounter: Payer: Self-pay | Admitting: Physician Assistant

## 2018-04-30 NOTE — Telephone Encounter (Signed)
Left message advising of letter.  

## 2018-04-30 NOTE — Telephone Encounter (Signed)
Ok letter in epic.

## 2019-04-04 ENCOUNTER — Telehealth: Payer: Self-pay

## 2019-04-04 NOTE — Telephone Encounter (Signed)
Patient has not been seen since 03/16/18 and is past due for DM follow up.   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 Patient unable to schedule at this time. Thanked patient for all he is doing in this time, and asked that he just touch base with [place] upon his return so that we can arrange a follow up appt.   FYI to PCP. I have postponed his HM recommendations for next 3 months.

## 2019-04-04 NOTE — Telephone Encounter (Signed)
Thank you :)
